# Patient Record
Sex: Female | Born: 1945 | Race: White | Hispanic: No
Health system: Southern US, Community
[De-identification: ages and names within clinical notes are randomized; demographics above are authoritative.]

## PROBLEM LIST (undated history)

## (undated) DIAGNOSIS — E039 Hypothyroidism, unspecified: Secondary | ICD-10-CM

## (undated) DIAGNOSIS — R112 Nausea with vomiting, unspecified: Secondary | ICD-10-CM

## (undated) DIAGNOSIS — Z9889 Other specified postprocedural states: Secondary | ICD-10-CM

## (undated) DIAGNOSIS — R011 Cardiac murmur, unspecified: Secondary | ICD-10-CM

## (undated) DIAGNOSIS — Q201 Double outlet right ventricle: Secondary | ICD-10-CM

## (undated) DIAGNOSIS — Z8719 Personal history of other diseases of the digestive system: Secondary | ICD-10-CM

## (undated) DIAGNOSIS — S8290XA Unspecified fracture of unspecified lower leg, initial encounter for closed fracture: Secondary | ICD-10-CM

## (undated) HISTORY — PX: NASAL SEPTUM SURGERY: SHX37

## (undated) HISTORY — PX: CHOLECYSTECTOMY: SHX55

## (undated) HISTORY — PX: CARDIAC CATHETERIZATION: SHX172

## (undated) HISTORY — PX: APPENDECTOMY: SHX54

## (undated) HISTORY — PX: TONSILLECTOMY: SUR1361

## (undated) HISTORY — PX: ABDOMINAL HYSTERECTOMY: SHX81

## (undated) HISTORY — PX: TOOTH EXTRACTION: SHX859

---

## 2011-05-12 DIAGNOSIS — J069 Acute upper respiratory infection, unspecified: Secondary | ICD-10-CM | POA: Diagnosis not present

## 2012-01-02 DIAGNOSIS — E7800 Pure hypercholesterolemia, unspecified: Secondary | ICD-10-CM | POA: Diagnosis not present

## 2012-01-02 DIAGNOSIS — R5383 Other fatigue: Secondary | ICD-10-CM | POA: Diagnosis not present

## 2012-01-02 DIAGNOSIS — E559 Vitamin D deficiency, unspecified: Secondary | ICD-10-CM | POA: Diagnosis not present

## 2012-04-13 DIAGNOSIS — E2839 Other primary ovarian failure: Secondary | ICD-10-CM | POA: Diagnosis not present

## 2012-04-13 DIAGNOSIS — E039 Hypothyroidism, unspecified: Secondary | ICD-10-CM | POA: Diagnosis not present

## 2012-04-13 DIAGNOSIS — E7800 Pure hypercholesterolemia, unspecified: Secondary | ICD-10-CM | POA: Diagnosis not present

## 2012-12-09 DIAGNOSIS — R300 Dysuria: Secondary | ICD-10-CM | POA: Diagnosis not present

## 2013-05-03 DIAGNOSIS — E039 Hypothyroidism, unspecified: Secondary | ICD-10-CM | POA: Diagnosis not present

## 2013-05-17 DIAGNOSIS — E039 Hypothyroidism, unspecified: Secondary | ICD-10-CM | POA: Diagnosis not present

## 2013-05-17 DIAGNOSIS — R5383 Other fatigue: Secondary | ICD-10-CM | POA: Diagnosis not present

## 2013-05-17 DIAGNOSIS — E559 Vitamin D deficiency, unspecified: Secondary | ICD-10-CM | POA: Diagnosis not present

## 2013-05-17 DIAGNOSIS — R5381 Other malaise: Secondary | ICD-10-CM | POA: Diagnosis not present

## 2013-08-27 ENCOUNTER — Emergency Department (HOSPITAL_COMMUNITY)
Admission: EM | Admit: 2013-08-27 | Discharge: 2013-08-27 | Disposition: A | Discharge status: Home / Self Care | Payer: Medicare Other | Attending: Emergency Medicine | Admitting: Emergency Medicine

## 2013-08-27 ENCOUNTER — Encounter (HOSPITAL_COMMUNITY): Payer: Self-pay | Admitting: Emergency Medicine

## 2013-08-27 ENCOUNTER — Emergency Department (HOSPITAL_COMMUNITY): Payer: Medicare Other

## 2013-08-27 DIAGNOSIS — S8990XA Unspecified injury of unspecified lower leg, initial encounter: Secondary | ICD-10-CM | POA: Diagnosis not present

## 2013-08-27 DIAGNOSIS — S99919A Unspecified injury of unspecified ankle, initial encounter: Secondary | ICD-10-CM | POA: Diagnosis not present

## 2013-08-27 DIAGNOSIS — Y929 Unspecified place or not applicable: Secondary | ICD-10-CM | POA: Insufficient documentation

## 2013-08-27 DIAGNOSIS — Z91040 Latex allergy status: Secondary | ICD-10-CM | POA: Insufficient documentation

## 2013-08-27 DIAGNOSIS — S4980XA Other specified injuries of shoulder and upper arm, unspecified arm, initial encounter: Secondary | ICD-10-CM | POA: Diagnosis not present

## 2013-08-27 DIAGNOSIS — M2569 Stiffness of other specified joint, not elsewhere classified: Secondary | ICD-10-CM | POA: Insufficient documentation

## 2013-08-27 DIAGNOSIS — IMO0002 Reserved for concepts with insufficient information to code with codable children: Secondary | ICD-10-CM | POA: Diagnosis not present

## 2013-08-27 DIAGNOSIS — M2560 Stiffness of unspecified joint, not elsewhere classified: Secondary | ICD-10-CM

## 2013-08-27 DIAGNOSIS — M25519 Pain in unspecified shoulder: Secondary | ICD-10-CM | POA: Diagnosis not present

## 2013-08-27 DIAGNOSIS — Y939 Activity, unspecified: Secondary | ICD-10-CM | POA: Insufficient documentation

## 2013-08-27 DIAGNOSIS — S46909A Unspecified injury of unspecified muscle, fascia and tendon at shoulder and upper arm level, unspecified arm, initial encounter: Secondary | ICD-10-CM | POA: Diagnosis not present

## 2013-08-27 DIAGNOSIS — S99929A Unspecified injury of unspecified foot, initial encounter: Secondary | ICD-10-CM

## 2013-08-27 DIAGNOSIS — W19XXXA Unspecified fall, initial encounter: Secondary | ICD-10-CM

## 2013-08-27 DIAGNOSIS — R296 Repeated falls: Secondary | ICD-10-CM | POA: Insufficient documentation

## 2013-08-27 DIAGNOSIS — S46911A Strain of unspecified muscle, fascia and tendon at shoulder and upper arm level, right arm, initial encounter: Secondary | ICD-10-CM

## 2013-08-27 MED ORDER — TRAMADOL HCL 50 MG PO TABS: 50.0000 mg | ORAL_TABLET | Freq: Four times a day (QID) | ORAL | Status: DC | PRN

## 2013-08-27 MED ORDER — METHOCARBAMOL 500 MG PO TABS: 500.0000 mg | ORAL_TABLET | Freq: Two times a day (BID) | ORAL | Status: DC

## 2013-08-27 MED ORDER — IBUPROFEN 400 MG PO TABS
400.0000 mg | ORAL_TABLET | Freq: Once | ORAL | Status: AC
  Administered 2013-08-27: 400 mg via ORAL
  Filled 2013-08-27: qty 1

## 2013-08-27 NOTE — ED Notes (Signed)
The pt fell upstairs on the wet floor 1330.  The pt  Is c/o of rt shoulder and rt upper chest

## 2013-08-27 NOTE — ED Notes (Signed)
ED PA at bedside

## 2013-08-27 NOTE — Discharge Instructions (Signed)
RICE: Routine Care for Injuries The routine care of many injuries includes Rest, Ice, Compression, and Elevation (RICE). HOME CARE INSTRUCTIONS  Rest is needed to allow your body to heal. Routine activities can usually be resumed when comfortable. Injured tendons and bones can take up to 6 weeks to heal. Tendons are the cord-like structures that attach muscle to bone.  Ice following an injury helps keep the swelling down and reduces pain.  Put ice in a plastic bag.  Place a towel between your skin and the bag.  Leave the ice on for 15-20 minutes, 03-04 times a day. Do this while awake, for the first 24 to 48 hours. After that, continue as directed by your caregiver.  Compression helps keep swelling down. It also gives support and helps with discomfort. If an elastic bandage has been applied, it should be removed and reapplied every 3 to 4 hours. It should not be applied tightly, but firmly enough to keep swelling down. Watch fingers or toes for swelling, bluish discoloration, coldness, numbness, or excessive pain. If any of these problems occur, remove the bandage and reapply loosely. Contact your caregiver if these problems continue.  Elevation helps reduce swelling and decreases pain. With extremities, such as the arms, hands, legs, and feet, the injured area should be placed near or above the level of the heart, if possible. SEEK IMMEDIATE MEDICAL CARE IF:  You have persistent pain and swelling.  You develop redness, numbness, or unexpected weakness.  Your symptoms are getting worse rather than improving after several days. These symptoms may indicate that further evaluation or further X-rays are needed. Sometimes, X-rays may not show a small broken bone (fracture) until 1 week or 10 days later. Make a follow-up appointment with your caregiver. Ask when your X-ray results will be ready. Make sure you get your X-ray results. Document Released: 08/03/2000 Document Revised: 07/14/2011  Document Reviewed: 09/20/2010 [employer] Patient Information 2014 [employer].  

## 2013-08-27 NOTE — ED Provider Notes (Signed)
CSN: [id number]     Arrival date & time 08/27/13  1529 History   First MD Initiated Contact with Patient 08/27/13 1605 This chart was scribed for non-physician practitioner Fayrene HelperBowie Lundon Rosier, PA-C working with Gavin PoundMichael Y. Oletta LamasGhim, MD by Valera CastleSteven Perry, ED scribe. This patient was seen in room TR11C/TR11C and the patient's care was started at 4:19 PM.   Chief Complaint  Patient presents with  . Fall   (Consider location/radiation/quality/duration/timing/severity/associated sxs/prior Treatment) The history is provided by the patient. No language interpreter was used.   HPI Comments: Emily Berger is a 68 y.o. female who presents to the Emergency Department complaining of constant, right shoulder pain around her clavicle, onset around 1:30 pm today when she slipped on a wet floor, walked a few feet, then fell backwards landing awkwardly on her right arm when she tried to catch herself. She reports her pain radiates to her right shoulder blade and also to her right upper chest. She reports associated right shoulder and right neck stiffness. She reports her right shoulder pain is exacerbated with deep breathing and with right arm movement. She also reports mild, constant left knee pain, with mild swelling, onset since the fall. She denies taking any pain medication PTA. She denies h/o injury to her right shoulder, but reports h/o right elbow fx. She denies any broken bones with her fall. She denies hitting her head, LOC, wounds, and any other associated symptoms. She denies any medical history. No prior sxs prior to fall.   PCP - No primary provider on file.  History reviewed. No pertinent past medical history. History reviewed. No pertinent past surgical history. No family history on file. History  Substance Use Topics  . Smoking status: Never Smoker   . Smokeless tobacco: Not on file  . Alcohol Use: No   OB History   Grav Para Term Preterm Abortions TAB SAB Ect Mult Living                 Review of  Systems  Musculoskeletal: Positive for arthralgias (right shoulder, left knee), joint swelling (left knee), myalgias and neck stiffness. Negative for back pain and neck pain.  Skin: Negative for wound.  Neurological: Negative for dizziness, syncope, weakness and headaches.   Allergies  Latex and Percocet  Home Medications   Prior to Admission medications   Not on File   BP 115/63  Pulse 82  Temp(Src) 97.9 F (36.6 C) (Oral)  Resp 16  SpO2 98%  Physical Exam  Nursing note and vitals reviewed. Constitutional: She is oriented to person, place, and time. She appears well-developed and well-nourished. No distress.  HENT:  Head: Normocephalic and atraumatic.  Eyes: EOM are normal.  Neck: Neck supple. No tracheal deviation present.  Cardiovascular: Normal rate.   Pulmonary/Chest: Effort normal. No respiratory distress.  Musculoskeletal: She exhibits tenderness.  Mild tenderness noted to right anterior shoulder with palpation. Pt has decreased ROM especially when lifting arm above shoulder. No crepitus or deformity noted. Left knee: Tenderness noted to anterior aspect of left knee superior to patella without obvious deformity. Negative anterior and posterior drawer test. Normal varus and valgus movement.   Neurological: She is alert and oriented to person, place, and time.  Skin: Skin is warm and dry.  Psychiatric: She has a normal mood and affect. Her behavior is normal.   ED Course  Procedures (including critical care time)  DIAGNOSTIC STUDIES: Oxygen Saturation is 98% on room air, normal by my interpretation.    COORDINATION  OF CARE: 4:31 PM - Do not suspect fx, but when xray was offered pt complied.   5:21 PM Xray neg for acute fx.  Reassurance given.  Ibuprofen for pain.  RICE therapy discussed.  Return precaution given. Ortho referral as needed.  Pt otherwise without red flags, and NVI.    No results found for this or any previous visit. Dg Shoulder Right  08/27/2013    CLINICAL DATA:  Pt states that she slipped on a freshly mopped floor, and hurt her shoulder as she was trying to catch herself. Pt states that pain radiates from the the sternum to the inferior portion of the scapula  EXAM: RIGHT SHOULDER - 2+ VIEW  COMPARISON:  None.  FINDINGS: Glenohumeral joint is intact. No evidence of scapular fracture or humeral fracture. The acromioclavicular joint is intact.  IMPRESSION: No fracture dislocation.   Electronically Signed   By: Genevive BiStewart  Edmunds M.D.   On: 08/27/2013 17:09    EKG Interpretation None     Medications  ibuprofen (ADVIL,MOTRIN) tablet 400 mg (400 mg Oral Given 08/27/13 1638)   MDM   Final diagnoses:  Fall from standing  Right shoulder strain    BP 115/63  Pulse 82  Temp(Src) 97.9 F (36.6 C) (Oral)  Resp 16  SpO2 98%  I have reviewed nursing notes and vital signs. I personally reviewed the imaging tests through PACS system  I reviewed available ER/hospitalization records thought the EMR   I personally performed the services described in this documentation, which was scribed in my presence. The recorded information has been reviewed and is accurate.     Fayrene HelperBowie Olanna Percifield, PA-C 08/27/13 1722

## 2013-08-27 NOTE — ED Notes (Signed)
Patient given juice per request

## 2013-08-28 NOTE — ED Provider Notes (Signed)
Medical screening examination/treatment/procedure(s) were performed by non-physician practitioner and as supervising physician I was immediately available for consultation/collaboration.  Jeffre Enriques Y. Benedicta Sultan, MD 08/28/13 0038 

## 2013-10-25 DIAGNOSIS — H60399 Other infective otitis externa, unspecified ear: Secondary | ICD-10-CM | POA: Diagnosis not present

## 2014-08-08 DIAGNOSIS — E039 Hypothyroidism, unspecified: Secondary | ICD-10-CM | POA: Diagnosis not present

## 2014-08-08 DIAGNOSIS — Z9119 Patient's noncompliance with other medical treatment and regimen: Secondary | ICD-10-CM | POA: Diagnosis not present

## 2014-09-25 DIAGNOSIS — E039 Hypothyroidism, unspecified: Secondary | ICD-10-CM | POA: Diagnosis not present

## 2015-01-23 DIAGNOSIS — R0789 Other chest pain: Secondary | ICD-10-CM | POA: Diagnosis not present

## 2015-01-23 DIAGNOSIS — K449 Diaphragmatic hernia without obstruction or gangrene: Secondary | ICD-10-CM | POA: Diagnosis not present

## 2015-01-23 DIAGNOSIS — R05 Cough: Secondary | ICD-10-CM | POA: Diagnosis not present

## 2016-08-29 DIAGNOSIS — E785 Hyperlipidemia, unspecified: Secondary | ICD-10-CM | POA: Diagnosis not present

## 2016-08-29 DIAGNOSIS — E559 Vitamin D deficiency, unspecified: Secondary | ICD-10-CM | POA: Diagnosis not present

## 2016-08-29 DIAGNOSIS — E039 Hypothyroidism, unspecified: Secondary | ICD-10-CM | POA: Diagnosis not present

## 2016-09-10 DIAGNOSIS — Z0000 Encounter for general adult medical examination without abnormal findings: Secondary | ICD-10-CM | POA: Diagnosis not present

## 2016-09-10 DIAGNOSIS — E559 Vitamin D deficiency, unspecified: Secondary | ICD-10-CM | POA: Diagnosis not present

## 2016-09-10 DIAGNOSIS — E039 Hypothyroidism, unspecified: Secondary | ICD-10-CM | POA: Diagnosis not present

## 2017-03-03 DIAGNOSIS — H25812 Combined forms of age-related cataract, left eye: Secondary | ICD-10-CM | POA: Diagnosis not present

## 2017-06-25 DIAGNOSIS — E039 Hypothyroidism, unspecified: Secondary | ICD-10-CM | POA: Diagnosis not present

## 2017-06-25 DIAGNOSIS — F29 Unspecified psychosis not due to a substance or known physiological condition: Secondary | ICD-10-CM | POA: Diagnosis not present

## 2017-06-26 DIAGNOSIS — Z0001 Encounter for general adult medical examination with abnormal findings: Secondary | ICD-10-CM | POA: Diagnosis not present

## 2017-06-26 DIAGNOSIS — E039 Hypothyroidism, unspecified: Secondary | ICD-10-CM | POA: Diagnosis not present

## 2017-07-02 DIAGNOSIS — F29 Unspecified psychosis not due to a substance or known physiological condition: Secondary | ICD-10-CM | POA: Diagnosis not present

## 2017-07-02 DIAGNOSIS — E039 Hypothyroidism, unspecified: Secondary | ICD-10-CM | POA: Diagnosis not present

## 2017-07-21 DIAGNOSIS — F29 Unspecified psychosis not due to a substance or known physiological condition: Secondary | ICD-10-CM | POA: Diagnosis not present

## 2017-07-21 DIAGNOSIS — F22 Delusional disorders: Secondary | ICD-10-CM | POA: Diagnosis not present

## 2017-07-21 DIAGNOSIS — E039 Hypothyroidism, unspecified: Secondary | ICD-10-CM | POA: Diagnosis not present

## 2018-01-12 DIAGNOSIS — E039 Hypothyroidism, unspecified: Secondary | ICD-10-CM | POA: Diagnosis not present

## 2018-01-12 DIAGNOSIS — F29 Unspecified psychosis not due to a substance or known physiological condition: Secondary | ICD-10-CM | POA: Diagnosis not present

## 2018-01-12 DIAGNOSIS — F22 Delusional disorders: Secondary | ICD-10-CM | POA: Diagnosis not present

## 2018-01-12 DIAGNOSIS — F064 Anxiety disorder due to known physiological condition: Secondary | ICD-10-CM | POA: Diagnosis not present

## 2018-07-10 ENCOUNTER — Encounter (HOSPITAL_COMMUNITY): Payer: Self-pay | Admitting: Emergency Medicine

## 2018-07-10 ENCOUNTER — Emergency Department (HOSPITAL_COMMUNITY): Payer: Medicare Other

## 2018-07-10 ENCOUNTER — Emergency Department (HOSPITAL_COMMUNITY)
Admission: EM | Admit: 2018-07-10 | Discharge: 2018-07-11 | Disposition: A | Discharge status: Home / Self Care | Payer: Medicare Other | Attending: Emergency Medicine | Admitting: Emergency Medicine

## 2018-07-10 ENCOUNTER — Other Ambulatory Visit: Payer: Self-pay

## 2018-07-10 DIAGNOSIS — W108XXA Fall (on) (from) other stairs and steps, initial encounter: Secondary | ICD-10-CM | POA: Insufficient documentation

## 2018-07-10 DIAGNOSIS — Y92009 Unspecified place in unspecified non-institutional (private) residence as the place of occurrence of the external cause: Secondary | ICD-10-CM | POA: Diagnosis not present

## 2018-07-10 DIAGNOSIS — Z91040 Latex allergy status: Secondary | ICD-10-CM | POA: Insufficient documentation

## 2018-07-10 DIAGNOSIS — Z79899 Other long term (current) drug therapy: Secondary | ICD-10-CM | POA: Diagnosis not present

## 2018-07-10 DIAGNOSIS — Y999 Unspecified external cause status: Secondary | ICD-10-CM | POA: Diagnosis not present

## 2018-07-10 DIAGNOSIS — S82852A Displaced trimalleolar fracture of left lower leg, initial encounter for closed fracture: Secondary | ICD-10-CM | POA: Insufficient documentation

## 2018-07-10 DIAGNOSIS — M25579 Pain in unspecified ankle and joints of unspecified foot: Secondary | ICD-10-CM

## 2018-07-10 DIAGNOSIS — Y9301 Activity, walking, marching and hiking: Secondary | ICD-10-CM | POA: Insufficient documentation

## 2018-07-10 DIAGNOSIS — M25572 Pain in left ankle and joints of left foot: Secondary | ICD-10-CM

## 2018-07-10 DIAGNOSIS — S82821A Torus fracture of lower end of right fibula, initial encounter for closed fracture: Secondary | ICD-10-CM | POA: Diagnosis not present

## 2018-07-10 DIAGNOSIS — S9305XA Dislocation of left ankle joint, initial encounter: Secondary | ICD-10-CM

## 2018-07-10 DIAGNOSIS — S82899A Other fracture of unspecified lower leg, initial encounter for closed fracture: Secondary | ICD-10-CM

## 2018-07-10 DIAGNOSIS — S99912A Unspecified injury of left ankle, initial encounter: Secondary | ICD-10-CM | POA: Diagnosis present

## 2018-07-10 MED ORDER — FENTANYL CITRATE (PF) 100 MCG/2ML IJ SOLN
100.0000 ug | Freq: Once | INTRAMUSCULAR | Status: AC
  Administered 2018-07-10: 100 ug via INTRAVENOUS
  Filled 2018-07-10: qty 2

## 2018-07-10 MED ORDER — PROPOFOL 10 MG/ML IV BOLUS
INTRAVENOUS | Status: AC
  Administered 2018-07-10: 50 mg via INTRAVENOUS
  Filled 2018-07-10: qty 20

## 2018-07-10 MED ORDER — PROPOFOL 10 MG/ML IV BOLUS
0.5000 mg/kg | Freq: Once | INTRAVENOUS | Status: AC
Start: 2018-07-10 — End: 2018-07-10
  Administered 2018-07-10: 50 mg via INTRAVENOUS
  Filled 2018-07-10: qty 20

## 2018-07-10 MED ORDER — PROPOFOL 10 MG/ML IV BOLUS
0.5000 mg/kg | Freq: Once | INTRAVENOUS | Status: AC
  Administered 2018-07-10: 50 mg via INTRAVENOUS

## 2018-07-10 MED ORDER — LIDOCAINE-EPINEPHRINE (PF) 2 %-1:200000 IJ SOLN
20.0000 mL | Freq: Once | INTRAMUSCULAR | Status: AC
  Administered 2018-07-10: 10 mL
  Filled 2018-07-10: qty 20

## 2018-07-10 MED ORDER — LIDOCAINE-EPINEPHRINE 2 %-1:100000 IJ SOLN: 20.0000 mL | Freq: Once | INTRAMUSCULAR | Status: DC

## 2018-07-10 MED ORDER — MORPHINE SULFATE 15 MG PO TABS: 15.0000 mg | ORAL_TABLET | ORAL | 0 refills | Status: DC | PRN

## 2018-07-10 NOTE — ED Notes (Signed)
Bed: WA21 Expected date:  Expected time:  Means of arrival:  Comments: EMS 73 yo female from home-fall/deformity left ankle-outward rotation-good PMS/Fentanyl 150 mcg

## 2018-07-10 NOTE — ED Triage Notes (Signed)
Patient arrives by [facility] after a fall-missed the last step on the stairs and fell injuring left ankle-positive deformity with outward rotation per EMS-PMS intact. Fentanyl 150 mcg administered for pain per EMS.

## 2018-07-10 NOTE — ED Provider Notes (Signed)
[facility]-EMERGENCY DEPT Provider Note   CSN: [id number] Arrival date & time: 07/10/18  2004    History   Chief Complaint Chief Complaint  Patient presents with  . Fall  . Left ankle injury    HPI Emily Berger is a 73 y.o. female.     73 yo F with a chief complaint of fall.  Patient fell down 1 step.  States that she has been staying at a place where the stairs in the ground look very similar and so she has been counting steps but she missed counted them while she was at the bottom but had another stepped ago and her heel got caught on the bottom step and she inverted her ankle.  Pain and deformity was noted and she was unable to ambulate afterwards.  She denies pain to the knee denies other injury denies head injury chest pain shortness of breath neck pain back pain.  The history is provided by the patient.  Fall  Pertinent negatives include no chest pain, no abdominal pain, no headaches and no shortness of breath.  Injury  This is a new problem. The current episode started less than 1 hour ago. The problem occurs constantly. The problem has not changed since onset.Pertinent negatives include no chest pain, no abdominal pain, no headaches and no shortness of breath. The symptoms are aggravated by bending and twisting. Nothing relieves the symptoms. She has tried nothing for the symptoms. The treatment provided no relief.    History reviewed. No pertinent past medical history.  There are no active problems to display for this patient.   History reviewed. No pertinent surgical history.   OB History   No obstetric history on file.      Home Medications    Prior to Admission medications   Medication Sig Start Date End Date Taking? Authorizing Provider  methocarbamol (ROBAXIN) 500 MG tablet Take 1 tablet (500 mg total) by mouth 2 (two) times daily. 08/27/13   Fayrene Helper, PA-C  thyroid (ARMOUR) 30 MG tablet Take 30 mg by mouth daily before breakfast.     [provider]  traMADol (ULTRAM) 50 MG tablet Take 1 tablet (50 mg total) by mouth every 6 (six) hours as needed for severe pain. 08/27/13   Fayrene Helper, PA-C    Family History No family history on file.  Social History Social History   Tobacco Use  . Smoking status: Never Smoker  . Smokeless tobacco: Never Used  Substance Use Topics  . Alcohol use: No  . Drug use: Not on file     Allergies   Latex; Ambien [zolpidem tartrate]; and Percocet [oxycodone-acetaminophen]   Review of Systems Review of Systems  Constitutional: Negative for chills and fever.  HENT: Negative for congestion and rhinorrhea.   Eyes: Negative for redness and visual disturbance.  Respiratory: Negative for shortness of breath and wheezing.   Cardiovascular: Negative for chest pain and palpitations.  Gastrointestinal: Negative for abdominal pain, nausea and vomiting.  Genitourinary: Negative for dysuria and urgency.  Musculoskeletal: Positive for arthralgias and myalgias.  Skin: Negative for pallor and wound.  Neurological: Negative for dizziness and headaches.     Physical Exam Updated Vital Signs There were no vitals taken for this visit.  Physical Exam Vitals signs and nursing note reviewed.  Constitutional:      General: She is not in acute distress.    Appearance: She is well-developed. She is not diaphoretic.  HENT:     Head: Normocephalic  and atraumatic.  Eyes:     Pupils: Pupils are equal, round, and reactive to light.  Neck:     Musculoskeletal: Normal range of motion and neck supple.  Cardiovascular:     Rate and Rhythm: Normal rate and regular rhythm.     Heart sounds: No murmur. No friction rub. No gallop.   Pulmonary:     Effort: Pulmonary effort is normal.     Breath sounds: No wheezing or rales.  Abdominal:     General: There is no distension.     Palpations: Abdomen is soft.     Tenderness: There is no abdominal tenderness.  Musculoskeletal:        General:  Tenderness and deformity present.     Comments: PMS intact to the L foot.  Ankle is obviously dislocated.  Laterally displaced.  Skin:    General: Skin is warm and dry.  Neurological:     Mental Status: She is alert and oriented to person, place, and time.  Psychiatric:        Behavior: Behavior normal.      ED Treatments / Results  Labs (all labs ordered are listed, but only abnormal results are displayed) Labs Reviewed - No data to display  EKG None  Radiology No results found.  Procedures Reduction of dislocation Date/Time: 07/10/2018 11:24 PM Performed by: Melene Plan, DO Authorized by: Melene Plan, DO  Consent: Verbal consent obtained. Risks and benefits: risks, benefits and alternatives were discussed Consent given by: patient Imaging studies: imaging studies available Patient identity confirmed: verbally with patient Time out: Immediately prior to procedure a "time out" was called to verify the correct patient, procedure, equipment, support staff and site/side marked as required. Local anesthesia used: yes Anesthesia: local infiltration  Anesthesia: Local anesthesia used: yes Local Anesthetic: lidocaine 1% with epinephrine Anesthetic total: 10 mL  Sedation: Patient sedated: yes Sedation type: moderate (conscious) sedation Sedatives: fentanyl and propofol Analgesia: fentanyl Vitals: Vital signs were monitored during sedation.  Patient tolerance: Patient tolerated the procedure well with no immediate complications  .Joint Aspiration/Arthrocentesis Date/Time: 07/10/2018 11:24 PM Performed by: Melene Plan, DO Authorized by: Melene Plan, DO   Consent:    Consent obtained:  Verbal   Consent given by:  Patient   Risks discussed:  Bleeding, infection, incomplete drainage and nerve damage   Alternatives discussed:  No treatment and delayed treatment Location:    Location:  Ankle   Ankle:  R ankle Anesthesia (see MAR for exact dosages):    Anesthesia method:   None Procedure details:    Needle gauge: 21.   Ultrasound guidance: no     Approach:  Anterior   Aspirate amount:  0   Aspirate characteristics:  Bloody   Steroid injected: no     Specimen collected: no   Post-procedure details:    Dressing:  Adhesive bandage   Patient tolerance of procedure:  Tolerated well, no immediate complications Comments:     Lidocaine for reduction .Sedation Date/Time: 07/10/2018 11:25 PM Performed by: Melene Plan, DO Authorized by: Melene Plan, DO   Consent:    Consent obtained:  Verbal   Consent given by:  Patient   Risks discussed:  Allergic reaction, dysrhythmia, inadequate sedation, nausea, vomiting and respiratory compromise necessitating ventilatory assistance and intubation   Alternatives discussed:  Analgesia without sedation, anxiolysis and regional anesthesia Universal protocol:    Imaging studies available: yes     Immediately prior to procedure a time out was called: yes  Patient identity confirmation method:  Verbally with patient and arm band Indications:    Procedure performed:  Dislocation reduction   Procedure necessitating sedation performed by:  Physician performing sedation Pre-sedation assessment:    Time since last food or drink:  4   ASA classification: class 2 - patient with mild systemic disease     Neck mobility: normal     Mouth opening:  3 or more finger widths   Thyromental distance:  3 finger widths   Mallampati score:  II - soft palate, uvula, fauces visible   Pre-sedation assessments completed and reviewed: airway patency, cardiovascular function, hydration status, mental status, nausea/vomiting, pain level, respiratory function and temperature   Immediate pre-procedure details:    Reassessment: Patient reassessed immediately prior to procedure     Reviewed: vital signs, relevant labs/tests and NPO status     Verified: bag valve mask available, emergency equipment available, intubation equipment available, IV patency  confirmed, oxygen available and suction available   Procedure details (see MAR for exact dosages):    Preoxygenation:  Nasal cannula   Sedation:  Propofol   Analgesia:  Fentanyl   Intra-procedure monitoring:  Blood pressure monitoring, cardiac monitor, continuous capnometry, continuous pulse oximetry, frequent LOC assessments and frequent vital sign checks   Intra-procedure events: hypoxia and respiratory depression     Intra-procedure events comment:  Period of apnea with O2 into the 70's, improved with stimulation and deep breathing   Intra-procedure management:  Supplemental oxygen   Total Provider sedation time (minutes):  60 Post-procedure details:    Attendance: Constant attendance by certified staff until patient recovered     Recovery: Patient returned to pre-procedure baseline     Post-sedation assessments completed and reviewed: airway patency, cardiovascular function, hydration status, mental status, nausea/vomiting, pain level, respiratory function and temperature     Patient is stable for discharge or admission: yes     Patient tolerance:  Tolerated well, no immediate complications   (including critical care time)  Medications Ordered in ED Medications  lidocaine-EPINEPHrine (XYLOCAINE W/EPI) 2 %-1:200000 (PF) injection 20 mL (has no administration in time range)     Initial Impression / Assessment and Plan / ED Course  I have reviewed the triage vital signs and the nursing notes.  Pertinent labs & imaging results that were available during my care of the patient were reviewed by me and considered in my medical decision making (see chart for details).        73 yo F with a chief complaint of left ankle pain.  This is after she fell down a step.  Inversion injury.  Obviously dislocated, will obtain a plain film.  Plain film with fracture dislocation is viewed by me.  Initial attempts to reduce was successful but during the splinting process the patient's ankle had  redislocated.  Discussed with orthopedics he recommended repeat attempt.  Repeated times with good reduction.  Patient was complaining of right ankle pain on my repeat evaluation, plain film viewed by me with a fibular fracture.  Will place in a cam walker as she is nonweightbearing in the left lower extremity.  Ortho follow-up.  11:33 PM:  I have discussed the diagnosis/risks/treatment options with the patient and family and believe the pt to be eligible for discharge home to follow-up with Ortho. We also discussed returning to the ED immediately if new or worsening sx occur. We discussed the sx which are most concerning (e.g., sudden worsening pain, fever, inability to tolerate by mouth) that  necessitate immediate return. Medications administered to the patient during their visit and any new prescriptions provided to the patient are listed below.  Medications given during this visit Medications  lidocaine-EPINEPHrine (XYLOCAINE W/EPI) 2 %-1:200000 (PF) injection 20 mL (10 mLs Infiltration Given by Other 07/10/18 2142)  fentaNYL (SUBLIMAZE) injection 100 mcg (100 mcg Intravenous Given 07/10/18 2135)  propofol (DIPRIVAN) 10 mg/mL bolus/IV push 31.1 mg (50 mg Intravenous Given 07/10/18 2138)  propofol (DIPRIVAN) 10 mg/mL bolus/IV push 31.1 mg (50 mg Intravenous Given 07/10/18 2245)     The patient appears reasonably screen and/or stabilized for discharge and I doubt any other medical condition or other [facility] requiring further screening, evaluation, or treatment in the ED at this time prior to discharge.    Final Clinical Impressions(s) / ED Diagnoses   Final diagnoses:  Left ankle pain    ED Discharge Orders    None       Melene Plan, DO 07/10/18 2334

## 2018-07-10 NOTE — Discharge Instructions (Signed)

## 2018-07-19 NOTE — Pre-Procedure Instructions (Signed)
Karalena Eddings [facility]  07/19/2018      [id number] [place], [place] - [address] AT [facility] [address] [facility] [address] [facility] [address] [place] [address] Phone: [phone] Fax: [phone]    Your procedure is scheduled on March 19th, 2020.  Report to [facility] Admitting at 06:30 A.M.  Call this number if you have problems the morning of surgery:  [phone]   Remember:  Do not eat or drink after midnight.    Take these medicines the morning of surgery with A SIP OF WATER: armour thyroid, and  Morphine if needed.    Do not wear jewelry, make-up or nail polish.  Do not wear lotions, powders, or perfumes, or deodorant.  Do not shave 48 hours prior to surgery.  Men may shave face and neck.  Do not bring valuables to the hospital.  [facility] is not responsible for any belongings or valuables.  Contacts, dentures or bridgework may not be worn into surgery.  Leave your suitcase in the car.  After surgery it may be brought to your room.  For patients admitted to the hospital, discharge time will be determined by your treatment team.  Patients discharged the day of surgery will not be allowed to drive home.    [facility]- Preparing For Surgery  Before surgery, you can play an important role. Because skin is not sterile, your skin needs to be as free of germs as possible. You can reduce the number of germs on your skin by washing with CHG (chlorahexidine gluconate) Soap before surgery.  CHG is an antiseptic cleaner which kills germs and bonds with the skin to continue killing germs even after washing.    Oral Hygiene is also important to reduce your risk of infection.  Remember - BRUSH YOUR TEETH THE MORNING OF SURGERY WITH YOUR REGULAR TOOTHPASTE  Please do not use if you have an allergy to CHG or antibacterial soaps. If your skin becomes reddened/irritated stop using the CHG.  Do not shave (including legs and underarms) for at least 48  hours prior to first CHG shower. It is OK to shave your face.  Please follow these instructions carefully.   1. Shower the NIGHT BEFORE SURGERY and the MORNING OF SURGERY with CHG.   2. If you chose to wash your hair, wash your hair first as usual with your normal shampoo.  3. After you shampoo, rinse your hair and body thoroughly to remove the shampoo.  4. Use CHG as you would any other liquid soap. You can apply CHG directly to the skin and wash gently with a scrungie or a clean washcloth.   5. Apply the CHG Soap to your body ONLY FROM THE NECK DOWN.  Do not use on open wounds or open sores. Avoid contact with your eyes, ears, mouth and genitals (private parts). Wash Face and genitals (private parts)  with your normal soap.  6. Wash thoroughly, paying special attention to the area where your surgery will be performed.  7. Thoroughly rinse your body with warm water from the neck down.  8. DO NOT shower/wash with your normal soap after using and rinsing off the CHG Soap.  9. Pat yourself dry with a CLEAN TOWEL.  10. Wear CLEAN PAJAMAS to bed the night before surgery, wear comfortable clothes the morning of surgery  11. Place CLEAN SHEETS on your bed the night of your first shower and DO NOT SLEEP WITH PETS.  Day of Surgery:  Do not apply any deodorants/lotions.  Please wear clean clothes to the hospital/surgery center.   Remember to brush your teeth WITH YOUR REGULAR TOOTHPASTE.

## 2018-07-20 ENCOUNTER — Other Ambulatory Visit (HOSPITAL_COMMUNITY): Payer: Self-pay | Admitting: Orthopedic Surgery

## 2018-07-20 ENCOUNTER — Encounter (HOSPITAL_COMMUNITY): Payer: Self-pay

## 2018-07-20 ENCOUNTER — Encounter (HOSPITAL_COMMUNITY)
Admission: RE | Admit: 2018-07-20 | Discharge: 2018-07-20 | Disposition: A | Discharge status: Home / Self Care | Payer: Medicare Other | Source: Ambulatory Visit | Attending: Orthopedic Surgery | Admitting: Orthopedic Surgery

## 2018-07-20 ENCOUNTER — Other Ambulatory Visit: Payer: Self-pay

## 2018-07-20 DIAGNOSIS — Z01812 Encounter for preprocedural laboratory examination: Secondary | ICD-10-CM

## 2018-07-20 HISTORY — DX: Personal history of other diseases of the digestive system: Z87.19

## 2018-07-20 HISTORY — DX: Cardiac murmur, unspecified: R01.1

## 2018-07-20 HISTORY — DX: Unspecified fracture of unspecified lower leg, initial encounter for closed fracture: S82.90XA

## 2018-07-20 HISTORY — DX: Other specified postprocedural states: R11.2

## 2018-07-20 HISTORY — DX: Hypothyroidism, unspecified: E03.9

## 2018-07-20 HISTORY — DX: Double outlet right ventricle: Q20.1

## 2018-07-20 HISTORY — DX: Other specified postprocedural states: Z98.890

## 2018-07-20 LAB — BASIC METABOLIC PANEL
Anion gap: 9 (ref 5–15)
BUN: 9 mg/dL (ref 8–23)
CO2: 26 mmol/L (ref 22–32)
CREATININE: 0.62 mg/dL (ref 0.44–1.00)
Calcium: 9.3 mg/dL (ref 8.9–10.3)
Chloride: 100 mmol/L (ref 98–111)
GFR calc Af Amer: >60 mL/min (ref >60)
GFR calc non Af Amer: >60 mL/min (ref >60)
Glucose, Bld: 95 mg/dL (ref 70–99)
Potassium: 3.5 mmol/L (ref 3.5–5.1)
Sodium: 135 mmol/L (ref 135–145)

## 2018-07-20 LAB — SURGICAL PCR SCREEN
MRSA, PCR: NEGATIVE
Staphylococcus aureus: NEGATIVE

## 2018-07-20 LAB — CBC
HCT: 38.4 % (ref 36.0–46.0)
Hemoglobin: 12.5 g/dL (ref 12.0–15.0)
MCH: 28.3 pg (ref 26.0–34.0)
MCHC: 32.6 g/dL (ref 30.0–36.0)
MCV: 86.9 fL (ref 80.0–100.0)
Platelets: 357 10*3/uL (ref 150–400)
RBC: 4.42 MIL/uL (ref 3.87–5.11)
RDW: 13.5 % (ref 11.5–15.5)
WBC: 9.3 10*3/uL (ref 4.0–10.5)
nRBC: 0 % (ref 0.0–0.2)

## 2018-07-20 NOTE — Progress Notes (Signed)
PCP - Nadyne Coombes Cardiologist - denies  Chest x-ray - not needed EKG - not needed Stress Test - denies ECHO - denies Cardiac Cath - denies  Anesthesia review: yes, Fayrene Fearing to review records requested  Patient denies shortness of breath, fever, cough and chest pain at PAT appointment   Patient verbalized understanding of instructions that were given to them at the PAT appointment. Patient was also instructed that they will need to review over the PAT instructions again at home before surgery.

## 2018-07-20 NOTE — Pre-Procedure Instructions (Signed)
Shawnika Riskin [facility]  07/20/2018      [id number] [place], [place] - [address] AT [facility] [address] [facility] [address] [facility] [address] [place] [address] Phone: [phone] Fax: [phone]    Your procedure is scheduled on March 19th, 2020.  Report to [facility] Admitting at 06:30 A.M.  Call this number if you have problems the morning of surgery:  [phone]   Remember:  Do not eat or drink after midnight.    Take these medicines the morning of surgery with A SIP OF WATER:  thyroid (ARMOUR THYROID) Morphine if needed  7 days prior to surgery STOP taking any Aspirin (unless otherwise instructed by your surgeon), Aleve, Naproxen, Ibuprofen, Motrin, Advil, Goody's, BC's, all herbal medications, fish oil, and all vitamins.     Do not wear jewelry, make-up or nail polish.  Do not wear lotions, powders, or perfumes, or deodorant.  Do not shave 48 hours prior to surgery.  Men may shave face and neck.  Do not bring valuables to the hospital.  [facility] is not responsible for any belongings or valuables.  Contacts, dentures or bridgework may not be worn into surgery.  Leave your suitcase in the car.  After surgery it may be brought to your room.  For patients admitted to the hospital, discharge time will be determined by your treatment team.  Patients discharged the day of surgery will not be allowed to drive home.    [place]- Preparing For Surgery  Before surgery, you can play an important role. Because skin is not sterile, your skin needs to be as free of germs as possible. You can reduce the number of germs on your skin by washing with CHG (chlorahexidine gluconate) Soap before surgery.  CHG is an antiseptic cleaner which kills germs and bonds with the skin to continue killing germs even after washing.    Oral Hygiene is also important to reduce your risk of infection.  Remember - BRUSH YOUR TEETH THE MORNING OF SURGERY WITH YOUR  REGULAR TOOTHPASTE  Please do not use if you have an allergy to CHG or antibacterial soaps. If your skin becomes reddened/irritated stop using the CHG.  Do not shave (including legs and underarms) for at least 48 hours prior to first CHG shower. It is OK to shave your face.  Please follow these instructions carefully.   1. Shower the NIGHT BEFORE SURGERY and the MORNING OF SURGERY with CHG.   2. If you chose to wash your hair, wash your hair first as usual with your normal shampoo.  3. After you shampoo, rinse your hair and body thoroughly to remove the shampoo.  4. Use CHG as you would any other liquid soap. You can apply CHG directly to the skin and wash gently with a scrungie or a clean washcloth.   5. Apply the CHG Soap to your body ONLY FROM THE NECK DOWN.  Do not use on open wounds or open sores. Avoid contact with your eyes, ears, mouth and genitals (private parts). Wash Face and genitals (private parts)  with your normal soap.  6. Wash thoroughly, paying special attention to the area where your surgery will be performed.  7. Thoroughly rinse your body with warm water from the neck down.  8. DO NOT shower/wash with your normal soap after using and rinsing off the CHG Soap.  9. Pat yourself dry with a CLEAN TOWEL.  10. Wear CLEAN PAJAMAS to  bed the night before surgery, wear comfortable clothes the morning of surgery  11. Place CLEAN SHEETS on your bed the night of your first shower and DO NOT SLEEP WITH PETS.    Day of Surgery:  Do not apply any deodorants/lotions.  Please wear clean clothes to the hospital/surgery center.   Remember to brush your teeth WITH YOUR REGULAR TOOTHPASTE.

## 2018-07-21 NOTE — Anesthesia Preprocedure Evaluation (Addendum)
Anesthesia Evaluation  Patient identified by MRN, date of birth, ID band Patient awake    Reviewed: Allergy & Precautions, NPO status , Patient's Chart, lab work & pertinent test results  History of Anesthesia Complications (+) PONV and history of anesthetic complications  Airway Mallampati: III  TM Distance: >3 FB Neck ROM: Full    Dental no notable dental hx. (+) Teeth Intact, Dental Advisory Given   Pulmonary neg pulmonary ROS,    Pulmonary exam normal breath sounds clear to auscultation       Cardiovascular Normal cardiovascular exam+ Valvular Problems/Murmurs (unrepaired VSD)  Rhythm:Regular Rate:Normal     Neuro/Psych negative neurological ROS  negative psych ROS   GI/Hepatic negative GI ROS, Neg liver ROS,   Endo/Other  Hypothyroidism   Renal/GU negative Renal ROS  negative genitourinary   Musculoskeletal negative musculoskeletal ROS (+)   Abdominal   Peds  Hematology negative hematology ROS (+)   Anesthesia Other Findings   Reproductive/Obstetrics negative OB ROS                            Anesthesia Physical Anesthesia Plan  ASA: II  Anesthesia Plan: Regional and MAC   Post-op Pain Management:    Induction: Intravenous  PONV Risk Score and Plan: 3 and Ondansetron, Dexamethasone, Treatment may vary due to age or medical condition, Midazolam and Propofol infusion  Airway Management Planned: Natural Airway and Simple Face Mask  Additional Equipment:   Intra-op Plan:   Post-operative Plan:   Informed Consent: I have reviewed the patients History and Physical, chart, labs and discussed the procedure including the risks, benefits and alternatives for the proposed anesthesia with the patient or authorized representative who has indicated his/her understanding and acceptance.     Dental advisory given  Plan Discussed with: CRNA  Anesthesia Plan Comments: (Pt reports  small VSD as child, says she was evaluated by multiple cardiologists and decision was made not to repair. Says she has had a murmur her whole life. PCP records reviewed, also state hx of unrepaired VSD. On exam she has a soft systolic murmur, she denies DOE, angina, LE edema, orthopnea, PND. EKG from PCP 01/23/15 showed sinus rhythm, rate 90, nonspecific ST changes. No indication for updated EKG at PAT. Copy on chart.)     Anesthesia Quick Evaluation

## 2018-07-22 ENCOUNTER — Inpatient Hospital Stay (HOSPITAL_COMMUNITY): Payer: Medicare Other | Admitting: Anesthesiology

## 2018-07-22 ENCOUNTER — Encounter (HOSPITAL_COMMUNITY): Payer: Self-pay

## 2018-07-22 ENCOUNTER — Inpatient Hospital Stay (HOSPITAL_COMMUNITY)
Admission: RE | Admit: 2018-07-22 | Discharge: 2018-07-27 | DRG: 494 | Disposition: A | Discharge status: Skilled Nursing Facility | Payer: Medicare Other | Attending: Orthopedic Surgery | Admitting: Orthopedic Surgery

## 2018-07-22 ENCOUNTER — Inpatient Hospital Stay (HOSPITAL_COMMUNITY): Payer: Medicare Other | Admitting: Physician Assistant

## 2018-07-22 ENCOUNTER — Other Ambulatory Visit: Payer: Self-pay

## 2018-07-22 ENCOUNTER — Encounter (HOSPITAL_COMMUNITY)
Admission: RE | Disposition: A | Discharge status: Skilled Nursing Facility | Payer: Self-pay | Source: Home / Self Care | Attending: Orthopedic Surgery

## 2018-07-22 DIAGNOSIS — R410 Disorientation, unspecified: Secondary | ICD-10-CM | POA: Diagnosis not present

## 2018-07-22 DIAGNOSIS — S9002XA Contusion of left ankle, initial encounter: Secondary | ICD-10-CM | POA: Diagnosis present

## 2018-07-22 DIAGNOSIS — Z91048 Other nonmedicinal substance allergy status: Secondary | ICD-10-CM

## 2018-07-22 DIAGNOSIS — K449 Diaphragmatic hernia without obstruction or gangrene: Secondary | ICD-10-CM | POA: Diagnosis present

## 2018-07-22 DIAGNOSIS — E039 Hypothyroidism, unspecified: Secondary | ICD-10-CM | POA: Diagnosis present

## 2018-07-22 DIAGNOSIS — M216X2 Other acquired deformities of left foot: Secondary | ICD-10-CM | POA: Diagnosis present

## 2018-07-22 DIAGNOSIS — S8264XA Nondisplaced fracture of lateral malleolus of right fibula, initial encounter for closed fracture: Secondary | ICD-10-CM | POA: Diagnosis present

## 2018-07-22 DIAGNOSIS — S82852A Displaced trimalleolar fracture of left lower leg, initial encounter for closed fracture: Secondary | ICD-10-CM | POA: Diagnosis present

## 2018-07-22 DIAGNOSIS — W19XXXA Unspecified fall, initial encounter: Secondary | ICD-10-CM | POA: Diagnosis present

## 2018-07-22 DIAGNOSIS — R011 Cardiac murmur, unspecified: Secondary | ICD-10-CM | POA: Diagnosis present

## 2018-07-22 DIAGNOSIS — S82852S Displaced trimalleolar fracture of left lower leg, sequela: Secondary | ICD-10-CM

## 2018-07-22 HISTORY — PX: ORIF ANKLE FRACTURE: SHX5408

## 2018-07-22 SURGERY — OPEN REDUCTION INTERNAL FIXATION (ORIF) ANKLE FRACTURE
Anesthesia: Monitor Anesthesia Care | Site: Ankle | Laterality: Left

## 2018-07-22 MED ORDER — DOCUSATE SODIUM 100 MG PO CAPS
100.0000 mg | ORAL_CAPSULE | Freq: Two times a day (BID) | ORAL | Status: DC
  Administered 2018-07-22 – 2018-07-27 (×10): 100 mg via ORAL
  Filled 2018-07-22 (×11): qty 1

## 2018-07-22 MED ORDER — HYDROMORPHONE HCL 1 MG/ML IJ SOLN
0.5000 mg | INTRAMUSCULAR | Status: DC | PRN
  Administered 2018-07-26: 0.5 mg via INTRAVENOUS
  Filled 2018-07-22: qty 1

## 2018-07-22 MED ORDER — THYROID 30 MG PO TABS
30.0000 mg | ORAL_TABLET | Freq: Every day | ORAL | Status: DC
  Administered 2018-07-22 – 2018-07-27 (×6): 30 mg via ORAL
  Filled 2018-07-22 (×6): qty 1

## 2018-07-22 MED ORDER — DEXMEDETOMIDINE HCL IN NACL 200 MCG/50ML IV SOLN
INTRAVENOUS | Status: AC
  Filled 2018-07-22: qty 50

## 2018-07-22 MED ORDER — DEXMEDETOMIDINE HCL IN NACL 200 MCG/50ML IV SOLN
INTRAVENOUS | Status: DC | PRN
  Administered 2018-07-22: 12 ug via INTRAVENOUS

## 2018-07-22 MED ORDER — ACETAMINOPHEN 325 MG PO TABS
325.0000 mg | ORAL_TABLET | Freq: Four times a day (QID) | ORAL | Status: DC | PRN
  Administered 2018-07-22 (×2): 325 mg via ORAL
  Administered 2018-07-24 – 2018-07-27 (×5): 650 mg via ORAL
  Filled 2018-07-22 (×6): qty 2
  Filled 2018-07-22: qty 1

## 2018-07-22 MED ORDER — VITAMIN D 25 MCG (1000 UNIT) PO TABS
1000.0000 [IU] | ORAL_TABLET | Freq: Every day | ORAL | Status: DC
  Administered 2018-07-22 – 2018-07-27 (×6): 1000 [IU] via ORAL
  Filled 2018-07-22 (×6): qty 1

## 2018-07-22 MED ORDER — PHENYLEPHRINE 40 MCG/ML (10ML) SYRINGE FOR IV PUSH (FOR BLOOD PRESSURE SUPPORT)
PREFILLED_SYRINGE | INTRAVENOUS | Status: AC
  Filled 2018-07-22: qty 10

## 2018-07-22 MED ORDER — LACTATED RINGERS IV SOLN
INTRAVENOUS | Status: DC | PRN
  Administered 2018-07-22: 07:00:00 via INTRAVENOUS

## 2018-07-22 MED ORDER — FENTANYL CITRATE (PF) 250 MCG/5ML IJ SOLN
INTRAMUSCULAR | Status: AC
  Filled 2018-07-22: qty 5

## 2018-07-22 MED ORDER — DEXAMETHASONE SODIUM PHOSPHATE 10 MG/ML IJ SOLN
INTRAMUSCULAR | Status: AC
  Filled 2018-07-22: qty 1

## 2018-07-22 MED ORDER — SODIUM CHLORIDE 0.9 % IV SOLN
INTRAVENOUS | Status: DC
  Administered 2018-07-22: 12:00:00 via INTRAVENOUS

## 2018-07-22 MED ORDER — ONDANSETRON HCL 4 MG PO TABS: 4.0000 mg | ORAL_TABLET | Freq: Four times a day (QID) | ORAL | Status: DC | PRN

## 2018-07-22 MED ORDER — ONDANSETRON HCL 4 MG/2ML IJ SOLN
INTRAMUSCULAR | Status: AC
  Filled 2018-07-22: qty 2

## 2018-07-22 MED ORDER — CHLORHEXIDINE GLUCONATE 4 % EX LIQD: 60.0000 mL | Freq: Once | CUTANEOUS | Status: DC

## 2018-07-22 MED ORDER — SODIUM CHLORIDE 0.9 % IV SOLN
INTRAVENOUS | Status: DC | PRN
  Administered 2018-07-22: 25 ug/min via INTRAVENOUS

## 2018-07-22 MED ORDER — OXYCODONE HCL 5 MG PO TABS
10.0000 mg | ORAL_TABLET | ORAL | Status: DC | PRN
  Filled 2018-07-22: qty 2

## 2018-07-22 MED ORDER — ACETAMINOPHEN 500 MG PO TABS: 1000.0000 mg | ORAL_TABLET | Freq: Once | ORAL | Status: DC

## 2018-07-22 MED ORDER — SENNA 8.6 MG PO TABS
1.0000 | ORAL_TABLET | Freq: Two times a day (BID) | ORAL | Status: DC
  Administered 2018-07-22 – 2018-07-27 (×10): 8.6 mg via ORAL
  Filled 2018-07-22 (×10): qty 1

## 2018-07-22 MED ORDER — CEFAZOLIN SODIUM-DEXTROSE 2-4 GM/100ML-% IV SOLN
2.0000 g | INTRAVENOUS | Status: AC
  Administered 2018-07-22: 2 g via INTRAVENOUS
  Filled 2018-07-22: qty 100

## 2018-07-22 MED ORDER — DIPHENHYDRAMINE HCL 12.5 MG/5ML PO ELIX: 12.5000 mg | ORAL_SOLUTION | ORAL | Status: DC | PRN

## 2018-07-22 MED ORDER — PROPOFOL 10 MG/ML IV BOLUS
INTRAVENOUS | Status: AC
  Filled 2018-07-22: qty 40

## 2018-07-22 MED ORDER — ONDANSETRON HCL 4 MG/2ML IJ SOLN
INTRAMUSCULAR | Status: DC | PRN
  Administered 2018-07-22: 4 mg via INTRAVENOUS

## 2018-07-22 MED ORDER — METHOCARBAMOL 1000 MG/10ML IJ SOLN
500.0000 mg | Freq: Four times a day (QID) | INTRAVENOUS | Status: DC | PRN
  Filled 2018-07-22: qty 5

## 2018-07-22 MED ORDER — FENTANYL CITRATE (PF) 250 MCG/5ML IJ SOLN
INTRAMUSCULAR | Status: DC | PRN
  Administered 2018-07-22 (×4): 25 ug via INTRAVENOUS

## 2018-07-22 MED ORDER — ENOXAPARIN SODIUM 40 MG/0.4ML ~~LOC~~ SOLN
40.0000 mg | SUBCUTANEOUS | Status: DC
  Administered 2018-07-23 – 2018-07-27 (×5): 40 mg via SUBCUTANEOUS
  Filled 2018-07-22 (×5): qty 0.4

## 2018-07-22 MED ORDER — PHENYLEPHRINE 40 MCG/ML (10ML) SYRINGE FOR IV PUSH (FOR BLOOD PRESSURE SUPPORT)
PREFILLED_SYRINGE | INTRAVENOUS | Status: DC | PRN
  Administered 2018-07-22: 120 ug via INTRAVENOUS

## 2018-07-22 MED ORDER — MIDAZOLAM HCL 2 MG/2ML IJ SOLN
INTRAMUSCULAR | Status: DC | PRN
  Administered 2018-07-22 (×2): 1 mg via INTRAVENOUS

## 2018-07-22 MED ORDER — MIDAZOLAM HCL 2 MG/2ML IJ SOLN
INTRAMUSCULAR | Status: AC
  Filled 2018-07-22: qty 2

## 2018-07-22 MED ORDER — FENTANYL CITRATE (PF) 100 MCG/2ML IJ SOLN: 25.0000 ug | INTRAMUSCULAR | Status: DC | PRN

## 2018-07-22 MED ORDER — SODIUM CHLORIDE 0.9 % IV SOLN: INTRAVENOUS | Status: DC

## 2018-07-22 MED ORDER — PROPOFOL 500 MG/50ML IV EMUL
INTRAVENOUS | Status: DC | PRN
  Administered 2018-07-22: 100 ug/kg/min via INTRAVENOUS

## 2018-07-22 MED ORDER — PROPOFOL 1000 MG/100ML IV EMUL
INTRAVENOUS | Status: AC
  Filled 2018-07-22: qty 200

## 2018-07-22 MED ORDER — ONDANSETRON HCL 4 MG/2ML IJ SOLN
4.0000 mg | Freq: Four times a day (QID) | INTRAMUSCULAR | Status: DC | PRN
  Administered 2018-07-23: 4 mg via INTRAVENOUS
  Filled 2018-07-22: qty 2

## 2018-07-22 MED ORDER — 0.9 % SODIUM CHLORIDE (POUR BTL) OPTIME
TOPICAL | Status: DC | PRN
  Administered 2018-07-22: 1000 mL

## 2018-07-22 MED ORDER — DEXAMETHASONE SODIUM PHOSPHATE 10 MG/ML IJ SOLN
INTRAMUSCULAR | Status: DC | PRN
  Administered 2018-07-22: 5 mg via INTRAVENOUS

## 2018-07-22 MED ORDER — OXYCODONE HCL 5 MG PO TABS
5.0000 mg | ORAL_TABLET | ORAL | Status: DC | PRN
  Administered 2018-07-23 (×2): 10 mg via ORAL
  Administered 2018-07-24: 5 mg via ORAL
  Filled 2018-07-22 (×3): qty 2

## 2018-07-22 MED ORDER — METHOCARBAMOL 500 MG PO TABS
500.0000 mg | ORAL_TABLET | Freq: Four times a day (QID) | ORAL | Status: DC | PRN
  Administered 2018-07-22: 250 mg via ORAL
  Administered 2018-07-23 – 2018-07-27 (×9): 500 mg via ORAL
  Filled 2018-07-22 (×10): qty 1

## 2018-07-22 SURGICAL SUPPLY — 64 items
ALCOHOL 70% 16 OZ (MISCELLANEOUS) ×3 IMPLANT
BANDAGE ESMARK 6X9 LF (GAUZE/BANDAGES/DRESSINGS) ×1 IMPLANT
BIT DRILL 2.5X2.75 QC CALB (BIT) ×2 IMPLANT
BIT DRILL 2.9 CANN QC NONSTRL (BIT) ×2 IMPLANT
BIT DRILL 3.5X5.5 QC CALB (BIT) ×2 IMPLANT
BLADE SURG 15 STRL LF DISP TIS (BLADE) ×1 IMPLANT
BLADE SURG 15 STRL SS (BLADE) ×3
BNDG CMPR 9X6 STRL LF SNTH (GAUZE/BANDAGES/DRESSINGS) ×1
BNDG COHESIVE 4X5 TAN STRL (GAUZE/BANDAGES/DRESSINGS) ×3 IMPLANT
BNDG COHESIVE 6X5 TAN STRL LF (GAUZE/BANDAGES/DRESSINGS) ×3 IMPLANT
BNDG ESMARK 6X9 LF (GAUZE/BANDAGES/DRESSINGS) ×3
CANISTER SUCT 3000ML PPV (MISCELLANEOUS) ×3 IMPLANT
CHLORAPREP W/TINT 26ML (MISCELLANEOUS) ×6 IMPLANT
COVER SURGICAL LIGHT HANDLE (MISCELLANEOUS) ×3 IMPLANT
COVER WAND RF STERILE (DRAPES) ×3 IMPLANT
CUFF TOURNIQUET SINGLE 34IN LL (TOURNIQUET CUFF) ×3 IMPLANT
CUFF TOURNIQUET SINGLE 44IN (TOURNIQUET CUFF) IMPLANT
DRAPE OEC MINIVIEW 54X84 (DRAPES) ×3 IMPLANT
DRAPE U-SHAPE 47X51 STRL (DRAPES) ×3 IMPLANT
DRSG MEPITEL 4X7.2 (GAUZE/BANDAGES/DRESSINGS) ×5 IMPLANT
DRSG PAD ABDOMINAL 8X10 ST (GAUZE/BANDAGES/DRESSINGS) ×6 IMPLANT
ELECT REM PT RETURN 9FT ADLT (ELECTROSURGICAL) ×3
ELECTRODE REM PT RTRN 9FT ADLT (ELECTROSURGICAL) ×1 IMPLANT
GAUZE SPONGE 4X4 12PLY STRL (GAUZE/BANDAGES/DRESSINGS) IMPLANT
GLOVE BIO SURGEON STRL SZ8 (GLOVE) ×1 IMPLANT
GLOVE BIOGEL PI IND STRL 8 (GLOVE) ×1 IMPLANT
GLOVE BIOGEL PI INDICATOR 8 (GLOVE) ×2
GLOVE ECLIPSE 8.0 STRL XLNG CF (GLOVE) ×1 IMPLANT
GLOVE SURG SS PI 8.0 STRL IVOR (GLOVE) ×4 IMPLANT
GOWN STRL REUS W/ TWL LRG LVL3 (GOWN DISPOSABLE) ×1 IMPLANT
GOWN STRL REUS W/ TWL XL LVL3 (GOWN DISPOSABLE) ×2 IMPLANT
GOWN STRL REUS W/TWL LRG LVL3 (GOWN DISPOSABLE) ×3
GOWN STRL REUS W/TWL XL LVL3 (GOWN DISPOSABLE) ×6
K-WIRE ACE 1.6X6 (WIRE) ×6
KIT BASIN OR (CUSTOM PROCEDURE TRAY) ×3 IMPLANT
KIT TURNOVER KIT B (KITS) ×3 IMPLANT
KWIRE ACE 1.6X6 (WIRE) IMPLANT
NS IRRIG 1000ML POUR BTL (IV SOLUTION) ×3 IMPLANT
PACK ORTHO EXTREMITY (CUSTOM PROCEDURE TRAY) ×3 IMPLANT
PAD ARMBOARD 7.5X6 YLW CONV (MISCELLANEOUS) ×6 IMPLANT
PAD CAST 4YDX4 CTTN HI CHSV (CAST SUPPLIES) ×1 IMPLANT
PADDING CAST COTTON 4X4 STRL (CAST SUPPLIES) ×3
PLATE ACE 100DE 10H (Plate) ×2 IMPLANT
SCREW ACE CAN 4.0 40M (Screw) ×6 IMPLANT
SCREW CORTICAL 3.5MM  16MM (Screw) ×6 IMPLANT
SCREW CORTICAL 3.5MM  20MM (Screw) ×2 IMPLANT
SCREW CORTICAL 3.5MM 14MM (Screw) ×4 IMPLANT
SCREW CORTICAL 3.5MM 16MM (Screw) IMPLANT
SCREW CORTICAL 3.5MM 20MM (Screw) IMPLANT
SCREW CORTICAL 3.5MM 22MM (Screw) ×2 IMPLANT
SPLINT PLASTER CAST XFAST 5X30 (CAST SUPPLIES) IMPLANT
SPLINT PLASTER XFAST SET 5X30 (CAST SUPPLIES) ×40
SPONGE LAP 18X18 RF (DISPOSABLE) ×3 IMPLANT
SUCTION FRAZIER HANDLE 10FR (MISCELLANEOUS) ×2
SUCTION TUBE FRAZIER 10FR DISP (MISCELLANEOUS) ×1 IMPLANT
SUT ETHILON 3 0 FSL (SUTURE) ×4 IMPLANT
SUT ETHILON 3 0 PS 1 (SUTURE) ×3 IMPLANT
SUT MNCRL AB 3-0 PS2 18 (SUTURE) IMPLANT
SUT VIC AB 2-0 CT1 27 (SUTURE) ×6
SUT VIC AB 2-0 CT1 TAPERPNT 27 (SUTURE) ×2 IMPLANT
TOWEL OR 17X24 6PK STRL BLUE (TOWEL DISPOSABLE) ×3 IMPLANT
TOWEL OR 17X26 10 PK STRL BLUE (TOWEL DISPOSABLE) ×3 IMPLANT
TUBE CONNECTING 12'X1/4 (SUCTIONS) ×1
TUBE CONNECTING 12X1/4 (SUCTIONS) ×2 IMPLANT

## 2018-07-22 NOTE — Progress Notes (Signed)
Pt's sister-in-law came up to this RN and informed me that pt has some "mental issues". RN asked her to elaborate more and she stated that pt was seen by Dr. Once and Dr. Though pt might have "schizophrenia" but that pt would never let staff know this and refused to take medications for this. Sister-in-law also informs me that pt has not been taking her thyroid medicine for months now and also that pt has been throwing up when eating and taking medications attributing this to missing back teeth. She also states that pt can have "bad mood swings" being happy one minute and very rude the next. RN thanked sister-in-law for information and told her I would make a note of it in her chart and that if we needed anything here at the hospital we would call her.

## 2018-07-22 NOTE — H&P (Signed)
Emily Berger is an 73 y.o. female.   Chief Complaint:  Left ankle pain HPI:  72 y/o female without significant PMH fell last week injuring both ankles.  She underwent closed reduction and splinting of a displaced trimal ankle fracture dislocation on the left and a nondisplaced Weber B lateral mal fracture on the right.  She presents today for open treatment of the left ankle fracture with internal dislocation.  Past Medical History:  Diagnosis Date  . Broken legs    bialteral  . 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 Heart murmur    slight  . History of hiatal hernia   . Hypothyroidism   . PONV (postoperative nausea and vomiting)     Past Surgical History:  Procedure Laterality Date  . ABDOMINAL HYSTERECTOMY    . APPENDECTOMY    . CARDIAC CATHETERIZATION     8x as a child  . CHOLECYSTECTOMY    . NASAL SEPTUM SURGERY    . TONSILLECTOMY    . TOOTH EXTRACTION      History reviewed. No pertinent family history. Social History:  reports that she has never smoked. She has never used smokeless tobacco. She reports that she does not drink alcohol or use drugs.  Allergies:  Allergies  Allergen Reactions  . Latex Other (See Comments)    Patient states she has not had a reaction, but that she would like to err on the side of caution.    Medications Prior to Admission  Medication Sig Dispense Refill  . Calcium-Vitamin D-Vitamin K (CALCIUM SOFT CHEWS PO) Take 1 tablet by mouth 2 (two) times daily.    . cholecalciferol (VITAMIN D3) 25 MCG (1000 UT) tablet Take 1,000 Units by mouth daily.    Marland Kitchen HYDROcodone-acetaminophen (NORCO/VICODIN) 5-325 MG tablet Take 1 tablet by mouth every 6 (six) hours as needed for moderate pain.    Marland Kitchen morphine (MSIR) 15 MG tablet Take 7.5 mg by mouth every 4 (four) hours as needed for severe pain.    . naproxen sodium (ALEVE) 220 MG tablet Take 220 mg by mouth 3 (three)  times daily.    Marland Kitchen thyroid (ARMOUR THYROID) 30 MG tablet Take 30 mg by mouth daily before breakfast.      Results for orders placed or performed during the hospital encounter of 07/20/18 (from the past 48 hour(s))  Surgical pcr screen     Status: None   Collection Time: 07/20/18  4:00 PM  Result Value Ref Range   MRSA, PCR NEGATIVE NEGATIVE   Staphylococcus aureus NEGATIVE NEGATIVE    Comment: (NOTE) The Xpert SA Assay (FDA approved for NASAL specimens in patients 38 years of age and older), is one component of a comprehensive surveillance program. It is not intended to diagnose infection nor to guide or monitor treatment. Performed at [facility], [address] [place], [place] [address]   Basic metabolic panel     Status: None   Collection Time: 07/20/18  4:05 PM  Result Value Ref Range   Sodium 135 135 - 145 mmol/L   Potassium 3.5 3.5 - 5.1 mmol/L   Chloride 100 98 - 111 mmol/L   CO2 26 22 - 32 mmol/L   Glucose, Bld 95 70 - 99 mg/dL   BUN 9 8 - 23 mg/dL   Creatinine, Ser 7.59 0.44 - 1.00 mg/dL   Calcium 9.3 8.9 - 10.3  mg/dL   GFR calc non Af Amer >60 >60 mL/min   GFR calc Af Amer >60 >60 mL/min   Anion gap 9 5 - 15    Comment: Performed at [facility], [address], [place], [place] [address]  CBC     Status: None   Collection Time: 07/20/18  4:05 PM  Result Value Ref Range   WBC 9.3 4.0 - 10.5 K/uL   RBC 4.42 3.87 - 5.11 MIL/uL   Hemoglobin 12.5 12.0 - 15.0 g/dL   HCT 50.3 54.6 - 56.8 %   MCV 86.9 80.0 - 100.0 fL   MCH 28.3 26.0 - 34.0 pg   MCHC 32.6 30.0 - 36.0 g/dL   RDW 12.7 51.7 - 00.1 %   Platelets 357 150 - 400 K/uL   nRBC 0.0 0.0 - 0.2 %    Comment: Performed at [facility], [address], [place], [place] [address]   No results found.  ROS  No recent f/c/n/v/wt loss  Blood pressure 135/70, pulse 94, temperature 98.8 F (37.1 C), temperature source Oral, height 5\' 6"  (1.676 m), weight 61.2 kg, SpO2 99 %. Physical Exam  wn wd  woman in nad.  A and ox  4.  Mood and affect normal.  EOMI.  resp unlabored.  L ankle splinted.  Brisk cap refill at the toes.  sens to LT intact dorsally and plantarly.  5/5 strength in PF and DF of the toes.  R ankle with cam boot in place.  Assessment/Plan L ankle trimal fracture dislocation - to OR for ORIF.  The risks and benefits of the alternative treatment options have been discussed in detail.  The patient wishes to proceed with surgery and specifically understands risks of bleeding, infection, nerve damage, blood clots, need for additional surgery, amputation and death.  R ankle nondisplaced lat mal fracture - closed treatment.  WBAT in cam boot.    Emily Arthurs, MD 07/22/2018, 7:27 AM

## 2018-07-22 NOTE — Discharge Instructions (Addendum)
Toni Arthurs, MD [facility]  Please read the following information regarding your care after surgery.  Medications  You only need a prescription for the narcotic pain medicine (ex. oxycodone, Percocet, Norco).  All of the other medicines listed below are available over the counter. X Aleve 2 pills twice a day for the first 3 days after surgery. X acetominophen (Tylenol) 650 mg every 4-6 hours as you need for minor to moderate pain X oxycodone as prescribed for severe pain X ok to take natural supplements (ie prune juice, calcium, etc)  Narcotic pain medicine (ex. oxycodone, Percocet, Vicodin) will cause constipation.  To prevent this problem, take the following medicines while you are taking any pain medicine. X docusate sodium (Colace) 100 mg twice a day X senna (Senokot) 2 tablets twice a day  X To help prevent blood clots, take a baby aspirin (81 mg) twice a day after surgery.  You should also get up every hour while you are awake to move around.    Weight Bearing X Do not bear any weight on the operated leg or foot. X Weight bear for transfers only in CAM boot on Right Lower extremity  Cast / Splint / Dressing X Keep your splint, cast or dressing clean and dry.  Dont put anything (coat hanger, pencil, etc) down inside of it.  If it gets damp, use a hair dryer on the cool setting to dry it.  If it gets soaked, call the office to schedule an appointment for a cast change.   After your dressing, cast or splint is removed; you may shower, but do not soak or scrub the wound.  Allow the water to run over it, and then gently pat it dry.  Swelling It is normal for you to have swelling where you had surgery.  To reduce swelling and pain, keep your toes above your nose for at least 3 days after surgery.  It may be necessary to keep your foot or leg elevated for several weeks.  If it hurts, it should be elevated.  Follow Up Call my office at [phone] when you are discharged  from the hospital or surgery center to schedule an appointment to be seen two weeks after surgery.  Call my office at [phone] if you develop a fever >101.5 F, nausea, vomiting, bleeding from the surgical site or severe pain.

## 2018-07-22 NOTE — Anesthesia Postprocedure Evaluation (Signed)
Anesthesia Post Note  Patient: Emily Berger  Procedure(s) Performed: Open reduction internal fixation left ankle trimalleolar fracture/dislocation (Left Ankle)     Patient location during evaluation: PACU Anesthesia Type: Regional and MAC Level of consciousness: awake and alert Pain management: pain level controlled Vital Signs Assessment: post-procedure vital signs reviewed and stable Respiratory status: spontaneous breathing, nonlabored ventilation, respiratory function stable and patient connected to nasal cannula oxygen Cardiovascular status: stable and blood pressure returned to baseline Postop Assessment: no apparent nausea or vomiting Anesthetic complications: no    Last Vitals:  Vitals:   07/22/18 1005 07/22/18 1031  BP: 111/64 (!) 97/51  Pulse: 71 77  Resp: 12 16  Temp: (!) 36.2 C (!) 36.3 C  SpO2: 100% 97%    Last Pain:  Vitals:   07/22/18 1605  TempSrc:   PainSc: 3                  [name] L [name]

## 2018-07-22 NOTE — Op Note (Signed)
07/22/2018  9:17 AM  PATIENT:  Emily Berger  73 y.o. female  PRE-OPERATIVE DIAGNOSIS: 1.  Displaced left ankle trimalleolar fracture      2.  Right ankle lateral malleolus fracture   POST-OPERATIVE DIAGNOSIS: 1.  Displaced left ankle trimalleolar fracture      2.  Left talus osteochondral defect      3.  Right ankle lateral malleolus fracture  Procedure(s): 1.  Open treatment of left displaced trimalleolar fracture with internal fixation and fixation of the posterior lip   2.  Debridement and microfracture of the left talus osteochondral defect   3.  Stress exam of left ankle under fluoro   4.   AP, lateral and mortise xrays of the left ankle   5.  Closed treatment of the right ankle lateral malleolus fracture without manipulation   6.  AP, mortise and lateral xrays of the right ankle  SURGEON:  Toni Arthurs, MD  ASSISTANT: Alfredo Martinez, PA-C  ANESTHESIA:   General, regional  EBL:  minimal   TOURNIQUET:  1:05@250  mm Hg  COMPLICATIONS:  None apparent  DISPOSITION:  Extubated, awake and stable to recovery.  INDICATION FOR PROCEDURE: The patient is a 73 year old female without significant past medical history.  She fell approximately a week ago injuring both ankles.  Radiographs in the emergency department revealed a nondisplaced fracture of the right ankle lateral malleolus.  On the left she was noted to have a trimalleolar fracture dislocation.  She underwent closed reduction and splinting of the left ankle and presents now for operative treatment of this displaced and unstable left ankle injury.  The risks and benefits of the alternative treatment options have been discussed in detail.  The patient wishes to proceed with surgery and specifically understands risks of bleeding, infection, nerve damage, blood clots, need for additional surgery, amputation and death.  PROCEDURE IN DETAIL:  After pre operative consent was obtained, and the correct operative site was identified, the  patient was brought to the operating room and placed supine on the OR table.  Anesthesia was administered.  Pre-operative antibiotics were administered.  A surgical timeout was taken.  The right lower extremity was then examined.  AP, mortise and lateral radiographs were obtained.  These show acceptable alignment of the fibular fracture.  The decision was made to proceed with closed treatment of this injury.  The patient's cam boot was reapplied.  The left lower extremity was then prepped and draped in standard sterile fashion with a tourniquet around the thigh.  The extremity was elevated and the tourniquet was inflated to 250 mmHg.  A longitudinal incision was made over the lateral malleolus.  Dissection was carried down through the subcutaneous tissues to the level of the periosteum.  The fracture site was identified.  It was mobilized and cleaned of all hematoma.  Attention was turned to the medial side of the ankle where a longitudinal incision was made.  Dissection was carried down through the subcutaneous tissues to the fracture site.  The fracture was cleaned of all hematoma.  All periosteum caught within the fracture site was debrided and excised.  The posterior malleolus fracture was mobilized from both the medial and lateral sides of the ankle allowing it to reduce appropriately.  The medial malleolus fracture was reduced and held with a tenaculum.  K wires were placed across the fracture site.  4 mm x 40 mm partially-threaded cannulated screws from the [employer] small frag set were inserted and both were noted to  have excellent purchase.  Attention was then turned to the lateral side of the ankle where the lateral malleolus fracture was reduced.  It was held with a tenaculum while a 3.5 mm fully threaded lag screw was inserted from posterior to anterior across the fracture site.  It was noted to compress the fracture site appropriately.  A 10 hole one third tubular plate was then contoured to fit the  lateral malleolus.  It was secured distally with 3 unicortical screws and proximally with 3 bicortical screws.  The posterior malleolus fracture fragment was then reduced and provisionally pinned.  A small incision was made anteriorly.  A 4 mm x 40 mm partially-threaded cannulated screw was inserted over the guidepin and was noted to scare the fracture fragment appropriately.  Final AP, lateral and mortise radiographs were obtained showing appropriate position and length of all hardware and appropriate reduction of all 3 fractures.  Stress examination was then performed revealing a stable syndesmosis and ankle mortise.  Wounds were irrigated copiously.  Deep subcutaneous tissues were approximated with Vicryl.  Superficial subcutaneous tissues were approximated with Monocryl.  Skin incisions were closed with horizontal mattress sutures of 3-0 nylon.  Sterile dressings were applied followed by well-padded short leg splint.  The tourniquet was released after application of the dressings.  Patient was awakened from anesthesia and transported to the recovery room in stable condition.  FOLLOW UP PLAN: The patient will be admitted for physical therapy and Occupational Therapy.  She will likely need placement in a skilled nursing facility for postop rehab.  She will be weightbearing as tolerated on the right lower extremity for transfers only.  Nonweightbearing on the left lower extremity.  Lovenox for DVT prophylaxis while an inpatient, and switch to aspirin 81 mg p.o. twice daily for discharge.  RADIOGRAPHS: 1.  AP, mortise and lateral radiographs of the right ankle are obtained intraoperatively.  These show a lateral malleolus Weber B fracture that is adequately reduced.  No other acute injuries are noted. 2.  AP, mortise and lateral radiographs of the left ankle are obtained intraoperatively.  These show interval reduction and fixation of medial, lateral and posterior malleolus fractures.  Hardware is  appropriately positioned and of the appropriate lengths.    Alfredo Martinez PA-C was present and scrubbed for the duration of the operative case. His assistance was essential in positioning the patient, prepping and draping, gaining and maintaining exposure, performing the operation, closing and dressing the wounds and applying the splint.

## 2018-07-22 NOTE — Evaluation (Addendum)
Physical Therapy Evaluation Patient Details Name: Emily Berger MRN: [id number] DOB: 11/30/1945 Today's Date: 07/22/2018   History of Present Illness  73 yo female s/p ORIF L ankle due to displaced trimalleloar fracture from fall. Pt also with R ankle nondisplaced lateral malleolar fracture in CAM boot, nonoperative at this time. PMH includes heart murmur, PONV, hiatial hernia.   Clinical Impression   Pt presents with difficulty performing mobility tasks, including bed mobility and transfers, s/p L ankle ORIF. Pt requires multiple verbal cues to maintain precautions at this time. Pt to benefit from acute PT to address deficits. Pt transferred to recliner at bedside with min assist for steadying, safety, and form this session. PT reinforced NWB LLE throughout as pt with tendency to try to place LLE on ground. Pt educated on quad sets  to perform this afternoon/evening to increase circulation, to pt's tolerance and limited by pain. Pt to d/c SNF upon d/c from acute setting at this time, to return to [facility] and regain independence with mobility. PT to progress mobility as tolerated, and will continue to follow acutely.    PT spoke with Dr. Victorino Dike on the phone, and per MD, pt should be transfer only with RLE at this time. Upon asking about pt's ability to use knee scooter as she was using PTA, Dr. Victorino Dike states he suspects this would be challenging for pt at this time using only "transfer weight" to propel self.       Follow Up Recommendations SNF;Supervision for mobility/OOB    Equipment Recommendations  Other (comment)(defer to next venue )    Recommendations for Other Services       Precautions / Restrictions Precautions Precautions: Fall Restrictions Weight Bearing Restrictions: Yes RLE Weight Bearing: Weight bearing as tolerated(with CAM boot, per op note transfer only. Called Dr. Laverta Baltimore office and spoke to the PA Register, who states maintain WBAT for transfers only until clarified with  Dr. Victorino Dike. ) LLE Weight Bearing: Non weight bearing      Mobility  Bed Mobility Overal bed mobility: Needs Assistance Bed Mobility: Supine to Sit     Supine to sit: HOB elevated;Min assist     General bed mobility comments: Min assist for LE translation to EOB, scooting to EOB. Increased time to perform.   Transfers Overall transfer level: Needs assistance Equipment used: Rolling walker (2 wheeled) Transfers: Stand Pivot Transfers;Sit to/from Stand Sit to Stand: Min assist;From elevated surface Stand pivot transfers: Min assist;From elevated surface       General transfer comment: Recliner placed to R of pt, perpendicular to bed in order to transfer to strong side. Min assist for steadying upon standing, reinforcing NWB on LLE. Min assist for guiding pt to recliner, instructing pt in pivot to recliner. Pt took a small step with RLE when translating toward chair. Verbal cuing for slow lowering, reaching UEs to recliner prior to sitting for safety.  Ambulation/Gait Ambulation/Gait assistance: (NT - unsure if pt is RLE WBAT for transfers only or for all mobility )              Stairs            Wheelchair Mobility    Modified Rankin (Stroke Patients Only)       Balance Overall balance assessment: Needs assistance Sitting-balance support: No upper extremity supported Sitting balance-Leahy Scale: Good     Standing balance support: Bilateral upper extremity supported Standing balance-Leahy Scale: Poor Standing balance comment: reliant on UE support at this time.  Pertinent Vitals/Pain Pain Assessment: No/denies pain    Home Living Family/patient expects to be discharged to:: Skilled nursing facility                      Prior Function Level of Independence: Needs assistance   Gait / Transfers Assistance Needed: Pt has been using knee scooter for mobility at home with daughter-in-law and son, assist for  transfers from daughter-in-law  ADL's / Homemaking Assistance Needed: Pt's daughter-in-law has been assisting pt with cooking, cleaning, dressing, and bathing since initial injury.   Comments: The scenario of pt fall was pt was at her church, missed a step, and landed poorly on LLE while her RLE caught her fall to some extent. Pt was on the ground for several minutes before brother who was mowing the lawn noticed her.      Hand Dominance   Dominant Hand: Right    Extremity/Trunk Assessment   Upper Extremity Assessment Upper Extremity Assessment: Overall WFL for tasks assessed    Lower Extremity Assessment Lower Extremity Assessment: Overall WFL for tasks assessed;LLE deficits/detail;RLE deficits/detail RLE Deficits / Details: In CAM boot in bed upon PT arrival to room. Able to perform SLR, quad set  RLE: Unable to fully assess due to immobilization LLE Deficits / Details: able to perform SLR, quad set  LLE: Unable to fully assess due to immobilization    Cervical / Trunk Assessment Cervical / Trunk Assessment: Normal  Communication   Communication: No difficulties  Cognition Arousal/Alertness: Awake/alert Behavior During Therapy: WFL for tasks assessed/performed Overall Cognitive Status: Within Functional Limits for tasks assessed                                 General Comments: Pt very talkative, somewhat forgetful during conversation this session      General Comments      Exercises     Assessment/Plan    PT Assessment Patient needs continued PT services  PT Problem List Decreased strength;Decreased mobility;Decreased safety awareness;Decreased range of motion;Decreased activity tolerance;Decreased balance       PT Treatment Interventions DME instruction;Functional mobility training;Gait training;Therapeutic activities;Neuromuscular re-education;Therapeutic exercise;Stair training;Balance training    PT Goals (Current goals can be found in the Care  Plan section)  Acute Rehab PT Goals Patient Stated Goal: return to active lifestyle PT Goal Formulation: With patient Time For Goal Achievement: 08/05/18 Potential to Achieve Goals: Good    Frequency Min 3X/week   Barriers to discharge        Co-evaluation               AM-PAC PT "6 Clicks" Mobility  Outcome Measure Help needed turning from your back to your side while in a flat bed without using bedrails?: A Little Help needed moving from lying on your back to sitting on the side of a flat bed without using bedrails?: A Little Help needed moving to and from a bed to a chair (including a wheelchair)?: A Little Help needed standing up from a chair using your arms (e.g., wheelchair or bedside chair)?: A Little Help needed to walk in hospital room?: A Lot Help needed climbing 3-5 steps with a railing? : A Lot 6 Click Score: 16    End of Session Equipment Utilized During Treatment: Other (comment);Gait belt(cam boot RLE ) Activity Tolerance: Patient tolerated treatment well Patient left: in chair;with chair alarm set;with family/visitor present;with call bell/phone within reach Nurse  Communication: Mobility status PT Visit Diagnosis: Other abnormalities of gait and mobility (R26.89);Difficulty in walking, not elsewhere classified (R26.2);History of falling (Z91.81)    Time: 1355-1430 PT Time Calculation (min) (ACUTE ONLY): 35 min   Charges:   PT Evaluation $PT Eval Low Complexity: 1 Low PT Treatments $Therapeutic Activity: 8-22 mins       Katriona Schmierer Terrial Rhodes, PT Acute Rehabilitation Services Pager [phone]  Office [phone]  Montie Gelardi D Despina Hidden 07/22/2018, 3:28 PM

## 2018-07-22 NOTE — Anesthesia Procedure Notes (Signed)
Anesthesia Regional Block: Popliteal block   Pre-Anesthetic Checklist: ,, timeout performed, Correct Patient, Correct Site, Correct Laterality, Correct Procedure, Correct Position, site marked, Risks and benefits discussed,  Surgical consent,  Pre-op evaluation,  At surgeon's request and post-op pain management  Laterality: Left  Prep: Maximum Sterile Barrier Precautions used, chloraprep       Needles:  Injection technique: Single-shot  Needle Type: Echogenic Stimulator Needle     Needle Length: 9cm  Needle Gauge: 22     Additional Needles:   Procedures:,,,, ultrasound used (permanent image in chart),,,,  Narrative:  Start time: 07/22/2018 7:12 AM End time: 07/22/2018 7:22 AM Injection made incrementally with aspirations every 5 mL.  Performed by: Personally  Anesthesiologist: Elmer Picker, MD  Additional Notes: Monitors applied. No increased pain on injection. No increased resistance to injection. Injection made in 5cc increments. Good needle visualization. Patient tolerated procedure well.

## 2018-07-22 NOTE — Anesthesia Procedure Notes (Signed)
Procedure Name: MAC Date/Time: 07/22/2018 7:35 AM Performed by: Alain Marion, CRNA Pre-anesthesia Checklist: Patient identified Oxygen Delivery Method: Simple face mask Placement Confirmation: positive ETCO2

## 2018-07-22 NOTE — Transfer of Care (Addendum)
Immediate Anesthesia Transfer of Care Note  Patient: Emily Berger  Procedure(s) Performed: Open reduction internal fixation left ankle trimalleolar fracture/dislocation (Left Ankle)  Patient Location: PACU  Anesthesia Type:MAC, Regional  Level of Consciousness: awake, alert  and oriented  Airway & Oxygen Therapy: Patient Spontanous Breathing and Patient connected to face mask oxygen  Post-op Assessment: Report given to RN and Post -op Vital signs reviewed and stable  Post vital signs: Reviewed and stable  Last Vitals:  Vitals Value Taken Time  BP 103/61 07/22/2018  9:33 AM  Temp    Pulse 71 07/22/2018  9:37 AM  Resp 9 07/22/2018  9:37 AM  SpO2 96 % 07/22/2018  9:37 AM  Vitals shown include unvalidated device data.  Last Pain:  Vitals:   07/22/18 0621  TempSrc:   PainSc: 0-No pain         Complications: No apparent anesthesia complications

## 2018-07-23 ENCOUNTER — Encounter (HOSPITAL_COMMUNITY): Payer: Self-pay | Admitting: Orthopedic Surgery

## 2018-07-23 MED ORDER — OXYCODONE HCL 5 MG PO TABS: 5.0000 mg | ORAL_TABLET | ORAL | 0 refills | Status: AC | PRN

## 2018-07-23 MED ORDER — SENNA 8.6 MG PO TABS: 2.0000 | ORAL_TABLET | Freq: Two times a day (BID) | ORAL | 0 refills | Status: DC

## 2018-07-23 MED ORDER — ASPIRIN EC 81 MG PO TBEC: 81.0000 mg | DELAYED_RELEASE_TABLET | Freq: Two times a day (BID) | ORAL | 0 refills | Status: DC

## 2018-07-23 MED ORDER — DOCUSATE SODIUM 100 MG PO CAPS: 100.0000 mg | ORAL_CAPSULE | Freq: Two times a day (BID) | ORAL | 0 refills | Status: DC

## 2018-07-23 NOTE — Progress Notes (Signed)
The patient is refusing to keep the bed alarm on for safety.  The bed alarm was explained to the patient. The patient appears paranoid and anxious at times.

## 2018-07-23 NOTE — Progress Notes (Signed)
OT Cancellation Note  Patient Details Name: Emily Berger MRN: [id number] DOB: 10/17/1945   Cancelled Treatment:    Reason Eval/Treat Not Completed: Patient declined, no reason specified  Fontaine No, OTR/L  Acute Rehabilitation Services Pager: [phone] Office: [phone] .  07/23/2018, 12:19 PM

## 2018-07-23 NOTE — Progress Notes (Signed)
Displaying traits of paranoid sczhiophrenia- questioning the tech as to which "cameras were watching them", while they were in the room. Then was showing her pictures she took on her phone of the room in the dark. Several of which are the profile of the computer on wheels; that she is convinced is a person in a witches outfit, watching her- she has slept very little for various reasones ; such as too much light, had staff cover the safety light on the wall. Along with labile periods, of crying. Staff have been in room at length to ease her anxiety. Doors to room left open to allow her the comfort of knowing people are close by/

## 2018-07-23 NOTE — Progress Notes (Signed)
Subjective: 1 Day Post-Op Procedure(s) (LRB): Open reduction internal fixation left ankle trimalleolar fracture/dislocation (Left)  Patient reports pain as mild.  Reports that block is still working.  Denies fever, chills, N/V, CP, SOB.  Tolerating POs well.  Admits to flatus.  States that she worked well with therapy.  Objective:   VITALS:  Temp:  [97 F (36.1 C)-98.6 F (37 C)] 98.1 F (36.7 C) (03/20 0130) Pulse Rate:  [71-91] 91 (03/20 0130) Resp:  [11-18] 15 (03/20 0130) BP: (97-141)/(51-91) 141/70 (03/20 0130) SpO2:  [93 %-100 %] 100 % (03/20 0130)  General: WDWN patient in NAD. Psych:  Appropriate mood and affect. Neuro:  A&O x 3, Moving all extremities, sensation subjectively diminished to light touch HEENT:  EOMs intact Chest:  Even non-labored respirations Skin:  SLS C/D/I, no rashes or lesions Extremities: warm/dry, no visible edema, erythema or echymosis.  No lymphadenopathy. Pulses: Popliteus 2+ MSK:  ROM: EHL/FHL intact, MMT: able to perform quad set    LABS Recent Labs    07/20/18 1605  HGB 12.5  WBC 9.3  PLT 357   Recent Labs    07/20/18 1605  NA 135  K 3.5  CL 100  CO2 26  BUN 9  CREATININE 0.62  GLUCOSE 95   No results for input(s): LABPT, INR in the last 72 hours.   Assessment/Plan: 1 Day Post-Op Procedure(s) (LRB): Open reduction internal fixation left ankle trimalleolar fracture/dislocation (Left)  NWB L LE WBAT for transfers only on R LE in CAM boot. Up with therapy. Scripts on chart. D/C to SNF when bed available. Plan for 2 week outpatient post-op visit with Dr. Victorino Dike.   Alfredo Martinez PA-C [facility] Office:  [phone]

## 2018-07-23 NOTE — Progress Notes (Signed)
PT Cancellation Note  Patient Details Name: Emily Berger MRN: [id number] DOB: [birth year]   Cancelled Treatment:    Reason Eval/Treat Not Completed: Patient declined, no reason specified Patient declined reporting "I feel weak" from sitting on BSC this morning. PT will continue to follow acutely.    Derek Mound, PTA Acute Rehabilitation Services Pager: [phone] Office: [phone]   07/23/2018, 10:13 AM

## 2018-07-23 NOTE — Plan of Care (Signed)

## 2018-07-23 NOTE — Progress Notes (Signed)
The patient has had periods of being paranoid and thinking that people are following her.  Nurse sat down at the bedside with her and attempted to talk to her and give reassurance.

## 2018-07-24 MED ORDER — TRAMADOL HCL 50 MG PO TABS
25.0000 mg | ORAL_TABLET | Freq: Four times a day (QID) | ORAL | Status: DC | PRN
  Administered 2018-07-24: 25 mg via ORAL
  Administered 2018-07-25 – 2018-07-27 (×7): 50 mg via ORAL
  Filled 2018-07-24 (×8): qty 1

## 2018-07-24 NOTE — Progress Notes (Signed)
In addition to previous note from this RN, Dr. Linna Caprice advised RN to discontinue narcotic administration. Nursing will continue to monitor.

## 2018-07-24 NOTE — Progress Notes (Signed)
RN called answering service requesting call back related to patient's mentation, confusion, and hallucinations.   Dr. Linna Caprice returned call and advised RN to administer Benadryl for symptom management.   RN spoke with patient's daughter-in-law Emily Berger (listed on POA form) during the 0008 hour. Emily Berger stated that her mother-in-law had behaviors very similar to this prior to surgery and it has only heightened the behaviors since surgery. States that she would like referral to specialist to have her mother-in-law's mentation further assessed.  Nursing will continue to monitor.

## 2018-07-24 NOTE — NC FL2 (Addendum)
[place] MEDICAID FL2 LEVEL OF CARE SCREENING TOOL     IDENTIFICATION  Patient Name: Emily Berger Birthdate: 11-25-1945 Sex: female Admission Date (Current Location): 07/22/2018  [facility] and [place] Number:  Producer, television/film/video and Address:  [facility], [address] [place], [place] [address]      Provider Number: [id number]  Attending Physician Name and Address:  Toni Arthurs, MD  Relative Name and Phone Number:       Current Level of Care: Hospital Recommended Level of Care: Skilled Nursing Facility Prior Approval Number:    Date Approved/Denied:   PASRR Number: [id number]   Discharge Plan: SNF    Current Diagnoses: Patient Active Problem List   Diagnosis Date Noted  . Trimalleolar fracture of ankle, closed, left, sequela 07/22/2018    Orientation RESPIRATION BLADDER Height & Weight     Time, Situation, Place, Self  Normal Continent Weight: 135 lb (61.2 kg) Height:  5\' 6"  (167.6 cm)  BEHAVIORAL SYMPTOMS/MOOD NEUROLOGICAL BOWEL NUTRITION STATUS      Continent Diet(regular diet, thin liquids)  AMBULATORY STATUS COMMUNICATION OF NEEDS Skin   Limited Assist Verbally Surgical wounds(Closed incision left leg, compression wrapped)                       Personal Care Assistance Level of Assistance  Bathing, Feeding, Dressing Bathing Assistance: Limited assistance Feeding assistance: Independent Dressing Assistance: Limited assistance     Functional Limitations Info  Sight, Hearing, Speech Sight Info: Adequate Hearing Info: Adequate Speech Info: Adequate    SPECIAL CARE FACTORS FREQUENCY  PT (By licensed PT), OT (By licensed OT)     PT Frequency: 5x OT Frequency: 5x            Contractures Contractures Info: Not present    Additional Factors Info  Code Status, Allergies Code Status Info: Full Code Allergies Info: Latex           Current Medications (07/24/2018):  This is the current hospital active  medication list Current Facility-Administered Medications  Medication Dose Route Frequency Provider Last Rate Last Dose  . 0.9 %  sodium chloride infusion   Intravenous Continuous Jacinta Shoe, [place] 75 mL/hr at 07/22/18 1629    . acetaminophen (TYLENOL) tablet 325-650 mg  325-650 mg Oral Q6H PRN Jacinta Shoe, PA-C   650 mg at 07/24/18 1423  . cholecalciferol (VITAMIN D3) tablet 1,000 Units  1,000 Units Oral Daily Jacinta Shoe, PA-C   1,000 Units at 07/24/18 5883  . diphenhydrAMINE (BENADRYL) 12.5 MG/5ML elixir 12.5-25 mg  12.5-25 mg Oral Q4H PRN Jacinta Shoe, PA-C      . docusate sodium (COLACE) capsule 100 mg  100 mg Oral BID Jacinta Shoe, PA-C   100 mg at 07/24/18 2549  . enoxaparin (LOVENOX) injection 40 mg  40 mg Subcutaneous Q24H Jacinta Shoe, PA-C   40 mg at 07/24/18 8264  . HYDROmorphone (DILAUDID) injection 0.5-1 mg  0.5-1 mg Intravenous Q4H PRN Jacinta Shoe, PA-C      . methocarbamol (ROBAXIN) tablet 500 mg  500 mg Oral Q6H PRN Jacinta Shoe, PA-C   500 mg at 07/24/18 1005   Or  . methocarbamol (ROBAXIN) 500 mg in dextrose 5 % 50 mL IVPB  500 mg Intravenous Q6H PRN Jacinta Shoe, PA-C      . ondansetron [facility]) tablet 4 mg  4 mg Oral Q6H PRN Jacinta Shoe, PA-C  Or  . ondansetron (ZOFRAN) injection 4 mg  4 mg Intravenous Q6H PRN Jacinta Shoe, PA-C   4 mg at 07/23/18 2026  . oxyCODONE (Oxy IR/ROXICODONE) immediate release tablet 10-15 mg  10-15 mg Oral Q4H PRN Jacinta Shoe, PA-C      . oxyCODONE (Oxy IR/ROXICODONE) immediate release tablet 5-10 mg  5-10 mg Oral Q4H PRN Jacinta Shoe, PA-C   5 mg at 07/24/18 0040  . senna (SENOKOT) tablet 8.6 mg  1 tablet Oral BID Jacinta Shoe, PA-C   8.6 mg at 07/24/18 7711  . thyroid (ARMOUR) tablet 30 mg  30 mg Oral QAC breakfast Jacinta Shoe, [place]   30 mg at 07/24/18 6579     Discharge Medications: Please see discharge summary for a list of discharge  medications.  Relevant Imaging Results:  Relevant Lab Results:   Additional Information SSN: [id number]  Maree Krabbe, LCSW

## 2018-07-24 NOTE — Progress Notes (Signed)
Subjective: 2 Days Post-Op Procedure(s) (LRB): Open reduction internal fixation left ankle trimalleolar fracture/dislocation (Left) Patient reports pain as mild. Reports a good night. Tolerating PO's. Denies CP or SOB. Confusion improved this morning.   Objective: Vital signs in last 24 hours: Temp:  [97.7 F (36.5 C)-98.9 F (37.2 C)] 98.9 F (37.2 C) (03/21 0514) Pulse Rate:  [78-102] 93 (03/21 0514) Resp:  [16] 16 (03/20 1604) BP: (106-121)/(52-67) 120/67 (03/21 0514) SpO2:  [96 %-98 %] 98 % (03/21 0514)  Intake/Output from previous day: 03/20 0701 - 03/21 0700 In: 480 [P.O.:480] Out: -  Intake/Output this shift: No intake/output data recorded.  No results for input(s): HGB in the last 72 hours. No results for input(s): WBC, RBC, HCT, PLT in the last 72 hours. No results for input(s): NA, K, CL, CO2, BUN, CREATININE, GLUCOSE, CALCIUM in the last 72 hours. No results for input(s): LABPT, INR in the last 72 hours.  Alert and oriented x3. RRR, Lungs clear, BS x4. Left Calf soft and non tender. L ankle splint and dressing C/D/I. No DVT signs. No signs of infection or compartment syndrome. LLE grossly neurovascularly intact. Cam boot on RLE.    Assessment/Plan: 2 Days Post-Op Procedure(s) (LRB): Open reduction internal fixation left ankle trimalleolar fracture/dislocation (Left) NWB LLE Limited WB RLE tranfers only N/V checks routine Waiting on SNF placement Confusion from last night improved this am Continue other current care F/u with Dr. Victorino Dike in 2 weeks after d/c      Emily Berger 07/24/2018, 8:02 AM

## 2018-07-24 NOTE — Evaluation (Signed)
Occupational Therapy Evaluation Patient Details Name: Emily Berger MRN: [id number] DOB: 1945/12/12 Today's Date: 07/24/2018    History of Present Illness 73 yo female s/p ORIF L ankle due to displaced trimalleloar fracture from fall. Pt also with R ankle nondisplaced lateral malleolar fracture in CAM boot, nonoperative at this time. PMH includes heart murmur, PONV, hiatial hernia.    Clinical Impression   Pt was using a knee scooter and her daughter in law was assisting with ADL and IADL since her fall. Pt non-receptive to education with regard to compensatory strategies, but does think through activities and transfers prior to attempting. Pt requires up to min assist for ADL and demonstrated squat-pivot transfer to [facility] with min guard assist. Will follow. Recommending SNF for continued rehab prior to return home. Pt is in agreement.    Follow Up Recommendations  SNF;Supervision/Assistance - 24 hour    Equipment Recommendations       Recommendations for Other Services       Precautions / Restrictions Precautions Precautions: Fall Restrictions Weight Bearing Restrictions: Yes RLE Weight Bearing: Weight bearing as tolerated LLE Weight Bearing: Non weight bearing      Mobility Bed Mobility Overal bed mobility: Needs Assistance Bed Mobility: Supine to Sit;Sit to Supine     Supine to sit: Supervision Sit to supine: Supervision   General bed mobility comments: no physical assist, supervision for safety  Transfers Overall transfer level: Needs assistance Equipment used: None Transfers: Squat Pivot Transfers     Squat pivot transfers: Min guard     General transfer comment: to and from [facility]    Balance Overall balance assessment: Needs assistance   Sitting balance-Leahy Scale: Good                                     ADL either performed or assessed with clinical judgement   ADL Overall ADL's : Needs assistance/impaired Eating/Feeding:  Independent;Bed level   Grooming: Wash/dry hands;Sitting;Set up   Upper Body Bathing: Set up;Sitting   Lower Body Bathing: Minimal assistance;Sitting/lateral leans   Upper Body Dressing : Set up;Sitting   Lower Body Dressing: Minimal assistance;Sitting/lateral leans   Toilet Transfer: Min guard;Squat-pivot;BSC   Toileting- Clothing Manipulation and Hygiene: Set up;Sitting/lateral lean               Vision Patient Visual Report: No change from baseline       Perception     Praxis      Pertinent Vitals/Pain Pain Assessment: Faces Faces Pain Scale: Hurts little more Pain Location: L LE Pain Descriptors / Indicators: Sore Pain Intervention(s): Monitored during session;Repositioned;Patient requesting pain meds-RN notified     Hand Dominance Left   Extremity/Trunk Assessment Upper Extremity Assessment Upper Extremity Assessment: Overall WFL for tasks assessed(arthritic changes in hands)   Lower Extremity Assessment Lower Extremity Assessment: Defer to PT evaluation RLE Deficits / Details: in CAM boot LLE: Unable to fully assess due to immobilization   Cervical / Trunk Assessment Cervical / Trunk Assessment: Normal   Communication Communication Communication: No difficulties   Cognition Arousal/Alertness: Awake/alert Behavior During Therapy: WFL for tasks assessed/performed Overall Cognitive Status: Within Functional Limits for tasks assessed                                 General Comments: pt very talkative and somewhat non-receptive to education, prefers to  do things her way   General Comments       Exercises     Shoulder Instructions      Home Living Family/patient expects to be discharged to:: Skilled nursing facility Living Arrangements: Children                                      Prior Functioning/Environment Level of Independence: Needs assistance  Gait / Transfers Assistance Needed: Pt has been using knee  scooter for mobility at home with daughter-in-law and son, assist for transfers from daughter-in-law ADL's / Homemaking Assistance Needed: Pt's daughter-in-law has been assisting pt with cooking, cleaning, dressing, and bathing since initial injury.    Comments: The scenario of pt fall was pt was at her church, missed a step, and landed poorly on LLE while her RLE caught her fall to some extent. Pt was on the ground for several minutes before brother who was mowing the lawn noticed her.         OT Problem List: Impaired balance (sitting and/or standing);Decreased safety awareness;Decreased knowledge of use of DME or AE;Pain      OT Treatment/Interventions: Self-care/ADL training;DME and/or AE instruction;Patient/family education;Balance training;Therapeutic activities    OT Goals(Current goals can be found in the care plan section) Acute Rehab OT Goals Patient Stated Goal: return to active lifestyle OT Goal Formulation: With patient Time For Goal Achievement: 08/07/18 Potential to Achieve Goals: Good ADL Goals Pt Will Perform Lower Body Bathing: with supervision;sitting/lateral leans Pt Will Perform Lower Body Dressing: with supervision;sit to/from stand Pt Will Transfer to Toilet: with supervision;squat pivot transfer;stand pivot transfer;bedside commode Pt Will Perform Toileting - Clothing Manipulation and hygiene: with supervision;sitting/lateral leans Additional ADL Goal #1: Pt will adhere to NWB precautions on L LE during ADL and transfers with supervision.  OT Frequency: Min 2X/week   Barriers to D/C:            Co-evaluation              AM-PAC OT "6 Clicks" Daily Activity     Outcome Measure Help from another person eating meals?: None Help from another person taking care of personal grooming?: None Help from another person toileting, which includes using toliet, bedpan, or urinal?: A Little Help from another person bathing (including washing, rinsing, drying)?: A  Little Help from another person to put on and taking off regular upper body clothing?: None Help from another person to put on and taking off regular lower body clothing?: A Little 6 Click Score: 21   End of Session Equipment Utilized During Treatment: Gait belt Nurse Communication: Patient requests pain meds  Activity Tolerance: Patient tolerated treatment well Patient left: in bed;with call bell/phone within reach;with bed alarm set  OT Visit Diagnosis: Pain;Unsteadiness on feet (R26.81) Pain - Right/Left: Left Pain - part of body: Ankle and joints of foot                Time: 2703-5009 OT Time Calculation (min): 43 min Charges:  OT General Charges $OT Visit: 1 Visit OT Evaluation $OT Eval Low Complexity: 1 Low OT Treatments $Self Care/Home Management : 23-37 mins  Martie Round, OTR/L Acute Rehabilitation Services Pager: [phone] Office: [phone]  Evern Bio 07/24/2018, 2:03 PM

## 2018-07-24 NOTE — Progress Notes (Signed)
Family member visiting pt and introduced herself as "one of" patient's HCPOA. Said person wanted to know what pain meds pt receiving. Did notify pt that her info not on file to disclose however was advised that pt's pain management was under control as evidenced by pt statements of relief. Prior to this family member entering room, pt states she feels narcotics were increasing her confusion and did not wish to take them. Did offer pt non-narcotic alternatives which pt stated relieved her pain. Shortly after visitor entered pt room, received report from NT that pt is questioning her medications. Went to speak to pt with visitor at bedside and pt gave permission to discuss her medications. Pt states the current regimen is not working for her and is requesting narcotics. Asked pt what changed her opinion on use of narcotics and was told "well it just makes more sense" and that her foot was "controlling what she is saying".  Pt then requested "half of" a vicodin instead of meds ordered. Page placed to on call provider.

## 2018-07-24 NOTE — Progress Notes (Signed)
New orders received for tramadol. Informed pt of new orders and pt in agreement with new pain management plan.

## 2018-07-24 NOTE — TOC Initial Note (Signed)
Transition of Care [facility]) - Initial/Assessment Note    Patient Details  Name: Emily Berger MRN: [id number] Date of Birth: 01-06-1946  Transition of Care [facility]) CM/SW Contact:    Maree Krabbe, LCSW Phone Number: 07/24/2018, 2:24 PM  Clinical Narrative:   Pt is agreeable to SNF however would like to discuss with family today when they come to the hospital. Pt asking that CSW follow up tomorrow regarding choice.                 Expected Discharge Plan: Skilled Nursing Facility Barriers to Discharge: Continued Medical Work up, English as a second language teacher   Patient Goals and CMS Choice Patient states their goals for this hospitalization and ongoing recovery are:: to return home after snf      Expected Discharge Plan and Services Expected Discharge Plan: Skilled Nursing Facility In-house Referral: Clinical Social Work     Living arrangements for the past 2 months: Single Family Home                          Prior Living Arrangements/Services Living arrangements for the past 2 months: Single Family Home Lives with:: Adult Children Patient language and need for interpreter reviewed:: No Do you feel safe going back to the place where you live?: Yes      Need for Family Participation in Patient Care: No (Comment) Care giver support system in place?: Yes (comment)   Criminal Activity/Legal Involvement Pertinent to Current Situation/Hospitalization: No - Comment as needed  Activities of Daily Living Home Assistive Devices/Equipment: Wheelchair, Eyeglasses ADL Screening (condition at time of admission) Patient's cognitive ability adequate to safely complete daily activities?: Yes Is the patient deaf or have difficulty hearing?: No Does the patient have difficulty seeing, even when wearing glasses/contacts?: No Does the patient have difficulty concentrating, remembering, or making decisions?: No Patient able to express need for assistance with ADLs?: Yes Does the patient have  difficulty dressing or bathing?: No Independently performs ADLs?: Yes (appropriate for developmental age) Does the patient have difficulty walking or climbing stairs?: Yes Weakness of Legs: Both Weakness of Arms/Hands: None  Permission Sought/Granted Permission sought to share information with : Facility Medical sales representative, Family Supports                Emotional Assessment Appearance:: Appears stated age Attitude/Demeanor/Rapport: (pt was appropriate) Affect (typically observed): Accepting, Appropriate, Calm Orientation: : Oriented to Self, Oriented to Place, Oriented to  Time, Oriented to Situation Alcohol / Substance Use: Not Applicable Psych Involvement: No (comment)  Admission diagnosis:  Displaced timalleolar fracture of left lower leg Patient Active Problem List   Diagnosis Date Noted  . Trimalleolar fracture of ankle, closed, left, sequela 07/22/2018   PCP:  Dois Davenport, MD Pharmacy:   [facility] [phone] [place], [place] - [address] AT [facility] [place] [address] & [facility] [address] [place] [address] Phone: [phone] Fax: [phone]     Social Determinants of Health (SDOH) Interventions    Readmission Risk Interventions No flowsheet data found.

## 2018-07-25 MED ORDER — MAGNESIUM CITRATE PO SOLN
0.5000 | Freq: Once | ORAL | Status: DC
  Filled 2018-07-25: qty 296

## 2018-07-25 MED ORDER — MAGNESIUM CITRATE PO SOLN
0.5000 | Freq: Once | ORAL | Status: AC
  Administered 2018-07-25: 0.5 via ORAL
  Filled 2018-07-25: qty 296

## 2018-07-25 NOTE — Progress Notes (Signed)
Attempted to give pt second half of the mag citrate ordered for her bowels. Pt stated that she wasn't going to take it. All that the first dose did was "make me nauseous and dizzy". I attempted to calm pt, but she said there "was no use wasting oxygen." Stated that she was going to complain on her survey because the blood pressure cuffs were too tight. Pt asked for another prune juice. NT took juice to pt. No acute distress noted. Pt refusing to take any more medication stating that "our bodies are not meant to have that many chemicals in them".

## 2018-07-25 NOTE — Progress Notes (Signed)
Patient ID: Emily Berger, female   DOB: [birth year], 73 y.o.   MRN: [id number] Subjective: 3 Days Post-Op Procedure(s) (LRB): Open reduction internal fixation left ankle trimalleolar fracture/dislocation (Left)    Patient reports pain as moderate particularly as block has worn off.   Otherwise she reviewed with me all that has transpired and plans  Objective:   VITALS:   Vitals:   07/24/18 1436 07/24/18 2100  BP: (!) 142/62 136/68  Pulse: 95 93  Resp:    Temp: 99.1 F (37.3 C) 98.8 F (37.1 C)  SpO2: 97% 98%    Sensation intact distally Incision: LLE splint C/D/I  Right LE in Cam Walker  LABS No results for input(s): HGB, HCT, WBC, PLT in the last 72 hours.  No results for input(s): NA, K, BUN, CREATININE, GLUCOSE in the last 72 hours.  No results for input(s): LABPT, INR in the last 72 hours.   Assessment/Plan: 3 Days Post-Op Procedure(s) (LRB): Open reduction internal fixation left ankle trimalleolar fracture/dislocation (Left)   Up with therapy Plan for discharge tomorrow Discharge to SNF - likely tomorrow No changes in plans Continue to work with pain control

## 2018-07-25 NOTE — Plan of Care (Signed)
  Problem: Activity: Goal: Risk for activity intolerance will decrease Outcome: Progressing   Problem: Coping: Goal: Level of anxiety will decrease Outcome: Progressing   Problem: Safety: Goal: Ability to remain free from injury will improve Outcome: Progressing   Problem: Skin Integrity: Goal: Risk for impaired skin integrity will decrease Outcome: Progressing   

## 2018-07-26 NOTE — Plan of Care (Signed)
  Problem: Coping: Goal: Level of anxiety will decrease Outcome: Progressing   Problem: Safety: Goal: Ability to remain free from injury will improve Outcome: Progressing   Problem: Skin Integrity: Goal: Risk for impaired skin integrity will decrease Outcome: Progressing   

## 2018-07-26 NOTE — Discharge Summary (Signed)
Physician Discharge Summary  Patient ID: DAYRIN SCOFIELD MRN: [id number] DOB/AGE: February 27, 1946 73 y.o.  Admit date: 07/22/2018 Discharge date: 07/27/2018  Admission Diagnoses: L ankle trimalleolar fx; R ankle nondisplaced fibula fx; hypothyroidism; hx of heart murmur, post-op nausea/vomiting, hiatal hernia.   Discharge Diagnoses:  Active Problems:   Trimalleolar fracture of ankle, closed, left, sequela same as above  Discharged Condition: stable  Hospital Course: Patient presented to [facility] OR on 07/22/18 for ORIF L ankle trimalleolar fx by Dr. Toni Arthurs.  The patient tolerated the procedure well without complication. The patient was then admitted to the hospital.  She worked well with therapy.  She tolerated her stay well.  She will be D/C'd to SNF on 07/27/18.  Consults: PT/OT; case manaement  Significant Diagnostic Studies: radiology: X-Ray: to ensure satisfactory aligment during operative procedure.  Treatments: IV hydration, antibiotics: Ancef, analgesia: acetaminophen, Vicodin and Dilaudid, anticoagulation: lovenox and surgery: as stated above.  Discharge Exam: Blood pressure 124/66, pulse 76, temperature 98.7 F (37.1 C), temperature source Oral, resp. rate 14, height 5\' 6"  (1.676 m), weight 61.2 kg, SpO2 100 %. General: WDWN patient in NAD. Psych:  Appropriate mood and affect. Neuro:  A&O x 3, Moving all extremities, sensation intact to light touch HEENT:  EOMs intact Chest:  Even non-labored respirations Skin: SLS on L LE and CAM boot on R LE C/D/I, no rashes or lesions Extremities: warm/dry, no visible edema, erythema or echymosis.  No lymphadenopathy. Pulses: Popliteus 2+ MSK:  ROM: EHL/FHL intact, MMT: able to perform quad set   Disposition: Discharge disposition: 03-Skilled Nursing Facility       Discharge Instructions    Call MD / Call 911   Complete by:  As directed    If you experience chest pain or shortness of breath, CALL 911 and be transported to  the hospital emergency room.  If you develope a fever above 101 F, pus (white drainage) or increased drainage or redness at the wound, or calf pain, call your surgeon's office.   Constipation Prevention   Complete by:  As directed    Drink plenty of fluids.  Prune juice may be helpful.  You may use a stool softener, such as Colace (over the counter) 100 mg twice a day.  Use MiraLax (over the counter) for constipation as needed.   Diet - low sodium heart healthy   Complete by:  As directed    Increase activity slowly as tolerated   Complete by:  As directed    Non weight bearing   Complete by:  As directed    Laterality:  left   Extremity:  Lower     Allergies as of 07/27/2018      Reactions   Latex Other (See Comments)   Patient states she has not had a reaction, but that she would like to err on the side of caution.      Medication List    STOP taking these medications   HYDROcodone-acetaminophen 5-325 MG tablet Commonly known as:  NORCO/VICODIN   morphine 15 MG tablet Commonly known as:  MSIR     TAKE these medications   Armour Thyroid 30 MG tablet Generic drug:  thyroid Take 30 mg by mouth daily before breakfast.   aspirin EC 81 MG tablet Take 1 tablet (81 mg total) by mouth 2 (two) times daily.   CALCIUM SOFT CHEWS PO Take 1 tablet by mouth 2 (two) times daily.   cholecalciferol 25 MCG (1000 UT) tablet Commonly known  as:  VITAMIN D3 Take 1,000 Units by mouth daily.   docusate sodium 100 MG capsule Commonly known as:  Colace Take 1 capsule (100 mg total) by mouth 2 (two) times daily. While taking narcotic pain medicine.   naproxen sodium 220 MG tablet Commonly known as:  ALEVE Take 220 mg by mouth 3 (three) times daily.   oxyCODONE 5 MG immediate release tablet Commonly known as:  Roxicodone Take 1 tablet (5 mg total) by mouth every 4 (four) hours as needed for up to 5 days for moderate pain or severe pain.   senna 8.6 MG Tabs tablet Commonly known as:   SENOKOT Take 2 tablets (17.2 mg total) by mouth 2 (two) times daily.            Discharge Care Instructions  (From admission, onward)         Start     Ordered   07/27/18 0000  Non weight bearing    Question Answer Comment  Laterality left   Extremity Lower      07/27/18 1214          Contact information for follow-up providers    Toni Arthurs, MD. Schedule an appointment as soon as possible for a visit in 2 weeks.   Specialty:  Orthopedic Surgery Contact information: [address] [place] [address] [place] [address] [phone]            Contact information for after-discharge care    Destination    [facility] .   Service:  Skilled Nursing Contact information: [address] [place] [address] [phone]                  Signed: Lolly Mustache Office:  [phone]

## 2018-07-26 NOTE — Progress Notes (Signed)
Physical Therapy Treatment Patient Details Name: Emily Berger MRN: [id number] DOB: 1946/03/18 Today's Date: 07/26/2018    History of Present Illness 73 yo female s/p ORIF L ankle due to displaced trimalleloar fracture from fall. Pt also with R ankle nondisplaced lateral malleolar fracture in CAM boot, nonoperative at this time. PMH includes heart murmur, PONV, hiatial hernia.     PT Comments    Patient seen for mobility progression. Pt is mod I for bed mobility and min guard for safety with functional transfers this session. Continue to progress as tolerated.    Follow Up Recommendations  SNF;Supervision for mobility/OOB     Equipment Recommendations  Other (comment)(defer to next venue )    Recommendations for Other Services       Precautions / Restrictions Precautions Precautions: Fall Restrictions Weight Bearing Restrictions: Yes RLE Weight Bearing: Weight bearing as tolerated(transfers only in CAM boot) LLE Weight Bearing: Non weight bearing    Mobility  Bed Mobility Overal bed mobility: Modified Independent Bed Mobility: Supine to Sit;Sit to Supine              Transfers Overall transfer level: Needs assistance Equipment used: Rolling walker (2 wheeled) Transfers: Stand Pivot Transfers Sit to Stand: Min guard Stand pivot transfers: Min guard       General transfer comment: cues for safe hand placement and use of AD; pt likes to pull on RW to stand despite cues not to and although this seems much harder for her  Ambulation/Gait                 Stairs             Wheelchair Mobility    Modified Rankin (Stroke Patients Only)       Balance Overall balance assessment: Needs assistance Sitting-balance support: No upper extremity supported Sitting balance-Leahy Scale: Good     Standing balance support: Single extremity supported Standing balance-Leahy Scale: Poor Standing balance comment: pt is able to stand with single UE support                              Cognition Arousal/Alertness: Awake/alert Behavior During Therapy: WFL for tasks assessed/performed Overall Cognitive Status: Within Functional Limits for tasks assessed                                 General Comments: pt very talkative and somewhat non-receptive to education, prefers to do things her way      Exercises      General Comments General comments (skin integrity, edema, etc.): pt declined sitting OOB in recliner      Pertinent Vitals/Pain Pain Assessment: Faces Faces Pain Scale: Hurts a little bit Pain Location: L LE Pain Descriptors / Indicators: Sore Pain Intervention(s): Limited activity within patient's tolerance;Monitored during session;Premedicated before session;Repositioned    Home Living                      Prior Function            PT Goals (current goals can now be found in the care plan section) Acute Rehab PT Goals Patient Stated Goal: return to active lifestyle Progress towards PT goals: Progressing toward goals    Frequency    Min 3X/week      PT Plan Current plan remains appropriate    Co-evaluation  AM-PAC PT "6 Clicks" Mobility   Outcome Measure  Help needed turning from your back to your side while in a flat bed without using bedrails?: A Little Help needed moving from lying on your back to sitting on the side of a flat bed without using bedrails?: A Little Help needed moving to and from a bed to a chair (including a wheelchair)?: A Little Help needed standing up from a chair using your arms (e.g., wheelchair or bedside chair)?: A Little Help needed to walk in hospital room?: A Little Help needed climbing 3-5 steps with a railing? : Total 6 Click Score: 16    End of Session Equipment Utilized During Treatment: Other (comment);Gait belt(cam boot RLE ) Activity Tolerance: Patient tolerated treatment well Patient left: with call bell/phone within  reach;in bed Nurse Communication: Mobility status PT Visit Diagnosis: Other abnormalities of gait and mobility (R26.89);Difficulty in walking, not elsewhere classified (R26.2);History of falling (Z91.81)     Time: 3086-5784 PT Time Calculation (min) (ACUTE ONLY): 19 min  Charges:  $Gait Training: 8-22 mins                     Emily Berger, PTA Acute Rehabilitation Services Pager: [phone] Office: [phone]     Carolynne Edouard 07/26/2018, 12:10 PM

## 2018-07-26 NOTE — Plan of Care (Signed)
  Problem: Education: Goal: Knowledge of General Education information will improve Description Including pain rating scale, medication(s)/side effects and non-pharmacologic comfort measures Outcome: Progressing   Problem: Health Behavior/Discharge Planning: Goal: Ability to manage health-related needs will improve Outcome: Progressing   

## 2018-07-26 NOTE — Progress Notes (Signed)
Subjective: 4 Days Post-Op Procedure(s) (LRB): Open reduction internal fixation left ankle trimalleolar fracture/dislocation (Left)  Patient reports pain as mild to moderate.  Tolerating POs well.  States that she would rather take all natural supplements, such as prune juice, then medications to promote bowel movement.  Denies fever, chills, N/V, CP, SOB.  Hopeful to be D/C'd to SNF today.  Objective:   VITALS:  Temp:  [98.4 F (36.9 C)-98.6 F (37 C)] 98.4 F (36.9 C) (03/23 0603) Pulse Rate:  [75-105] 75 (03/23 0603) Resp:  [14-18] 16 (03/23 0603) BP: (114-132)/(56-72) 114/72 (03/23 0603) SpO2:  [94 %-100 %] 98 % (03/23 0603)  General: WDWN patient in NAD. Psych:  Appropriate mood and affect. Neuro:  A&O x 3, Moving all extremities, sensation intact to light touch HEENT:  EOMs intact Chest:  Even non-labored respirations Skin:  CAM boot and SLS C/D/I, no rashes or lesions Extremities: warm/dry, no visible edema, erythema or echymosis.  No lymphadenopathy. Pulses: Popliteus 2+ MSK:  ROM: EHL/FHL intact, MMT: able to perform quad set    LABS No results for input(s): HGB, WBC, PLT in the last 72 hours. No results for input(s): NA, K, CL, CO2, BUN, CREATININE, GLUCOSE in the last 72 hours. No results for input(s): LABPT, INR in the last 72 hours.   Assessment/Plan: 4 Days Post-Op Procedure(s) (LRB): Open reduction internal fixation left ankle trimalleolar fracture/dislocation (Left)  NWB L LE WBAT for transfers only on R LE in CAM boot  Up with therapy D/C to SNF when bed available Scripts on chart.  Please inform me when patient is ready for D/C and I will place order.  Alfredo Martinez PA-C [facility] Office:  [phone]

## 2018-07-26 NOTE — Progress Notes (Signed)
Pt requests to discuss her medication regimen several times today. Did explain each time and wrote down medications and due times per pt request. Each time pt shows no evidence of recalling previous discussion. Was informed on pain meds PRN. Pt refuses tramadol stating it's not "good for" her and she doesn't want "too much of that stuff". Later pt states she needs something "stronger" for pain. Did attempt to clarify meds, regimen, and use once more. Encouraged to call for nurse when in need of pain meds.

## 2018-07-27 NOTE — Progress Notes (Signed)
[facility] came to transport patient however pt declined. Family member outside ready for pick up. AVS printed and explained, no further questions. IV d/c.

## 2018-07-27 NOTE — Progress Notes (Signed)
Patient declined amublant transport and upset regarding being transferred to SNF. CSW explained importance of patient getting to SNF before 3:30 pm in order to ensure their pharmacy is open to fill hard script pain meds, and that narcotics cannot be faxed. Patient continued to be upset and requested to speak to DON.   DON spoke with patient yesterday 3/23 as well as today regarding discharge. Patient continued to be upset. CSW phoned patient's daughter Larita Fife who spoke with patient regarding dc and offered to pick patient up.   At this time patient's family Larita Fife and Tresa Endo will be picking patient up to transport to [facility].   [facility] notified.   [place], [place] [phone]

## 2018-07-27 NOTE — Plan of Care (Signed)
  Problem: Education: Goal: Knowledge of General Education information will improve Description Including pain rating scale, medication(s)/side effects and non-pharmacologic comfort measures 07/27/2018 1545 by Janan Halter, RN Outcome: Progressing 07/27/2018 1544 by Janan Halter, RN Outcome: Progressing   Problem: Health Behavior/Discharge Planning: Goal: Ability to manage health-related needs will improve 07/27/2018 1545 by Janan Halter, RN Outcome: Progressing 07/27/2018 1544 by Janan Halter, RN Outcome: Progressing   Problem: Clinical Measurements: Goal: Ability to maintain clinical measurements within normal limits will improve 07/27/2018 1545 by Janan Halter, RN Outcome: Progressing 07/27/2018 1544 by Janan Halter, RN Outcome: Progressing Goal: Will remain free from infection 07/27/2018 1545 by Janan Halter, RN Outcome: Progressing 07/27/2018 1544 by Janan Halter, RN Outcome: Progressing Goal: Diagnostic test results will improve 07/27/2018 1545 by Janan Halter, RN Outcome: Progressing 07/27/2018 1544 by Janan Halter, RN Outcome: Progressing Goal: Respiratory complications will improve 07/27/2018 1545 by Janan Halter, RN Outcome: Progressing 07/27/2018 1544 by Janan Halter, RN Outcome: Progressing Goal: Cardiovascular complication will be avoided 07/27/2018 1545 by Janan Halter, RN Outcome: Progressing 07/27/2018 1544 by Janan Halter, RN Outcome: Progressing   Problem: Activity: Goal: Risk for activity intolerance will decrease 07/27/2018 1545 by Janan Halter, RN Outcome: Progressing 07/27/2018 1544 by Janan Halter, RN Outcome: Progressing   Problem: Nutrition: Goal: Adequate nutrition will be maintained 07/27/2018 1545 by Janan Halter, RN Outcome: Progressing 07/27/2018 1544 by Janan Halter, RN Outcome: Progressing   Problem: Coping: Goal: Level of anxiety will decrease 07/27/2018 1545 by Janan Halter, RN Outcome: Progressing 07/27/2018 1544 by Janan Halter, RN Outcome: Progressing   Problem: Elimination: Goal: Will not experience complications related to bowel motility 07/27/2018 1545 by Janan Halter, RN Outcome: Progressing 07/27/2018 1544 by Janan Halter, RN Outcome: Progressing Goal: Will not experience complications related to urinary retention 07/27/2018 1545 by Janan Halter, RN Outcome: Progressing 07/27/2018 1544 by Janan Halter, RN Outcome: Progressing   Problem: Safety: Goal: Ability to remain free from injury will improve 07/27/2018 1545 by Janan Halter, RN Outcome: Progressing 07/27/2018 1544 by Janan Halter, RN Outcome: Progressing   Problem: Skin Integrity: Goal: Risk for impaired skin integrity will decrease 07/27/2018 1545 by Janan Halter, RN Outcome: Progressing 07/27/2018 1544 by Janan Halter, RN Outcome: Progressing

## 2018-07-27 NOTE — Progress Notes (Signed)
Patient is A&Ox4, but has been having a few episodes of paranoia. Says that an object on the ceiling is a camera watching her and took pictures last night saying a person was sitting outside of her room shooting a laser at her, but it was the computer with the scanner. Will continue to monitor pt for any other abnormalities.

## 2018-07-27 NOTE — Progress Notes (Signed)
Report called and given to Alisa at [facility]. Spoke with Larita Fife family member per pt request. Nurse at [facility] request script for tramadol to be sent to [facility].

## 2018-07-27 NOTE — Progress Notes (Signed)
Subjective: 5 Days Post-Op Procedure(s) (LRB): Open reduction internal fixation left ankle trimalleolar fracture/dislocation (Left)  Patient reports pain as mild to moderate.  Tolerating POs well.  Admits to flatus.  Has worked well with therapy.  Reports that she is ready for D/C to SNF.  Objective:   VITALS:  Temp:  [97.9 F (36.6 C)-98.8 F (37.1 C)] 98.7 F (37.1 C) (03/24 0459) Pulse Rate:  [76-84] 76 (03/24 0459) Resp:  [14-16] 14 (03/24 0459) BP: (119-124)/(61-66) 124/66 (03/24 0459) SpO2:  [95 %-100 %] 100 % (03/24 0459)  General: WDWN patient in NAD. Psych:  Appropriate mood and affect. Neuro:  A&O x 3, Moving all extremities, sensation intact to light touch HEENT:  EOMs intact Chest:  Even non-labored respirations Skin: SLS C/D/I, no rashes or lesions Extremities: warm/dry, no visible edema, erythema or echymosis.  No lymphadenopathy. Pulses: Popliteus 2+ MSK:  ROM: EHL/FHL intact, MMT: able to perform quad set    LABS No results for input(s): HGB, WBC, PLT in the last 72 hours. No results for input(s): NA, K, CL, CO2, BUN, CREATININE, GLUCOSE in the last 72 hours. No results for input(s): LABPT, INR in the last 72 hours.   Assessment/Plan: 5 Days Post-Op Procedure(s) (LRB): Open reduction internal fixation left ankle trimalleolar fracture/dislocation (Left)  NWB L LE WBAT for transfer only on R LE in CAM boot D/C to SNF today Scripts on chart.  Alfredo Martinez PA-C [facility] Office:  [phone]

## 2018-07-27 NOTE — Progress Notes (Signed)
Physical Therapy Treatment Patient Details Name: Emily Berger MRN: [id number] DOB: 03-17-1946 Today's Date: 07/27/2018    History of Present Illness 73 yo female s/p ORIF L ankle due to displaced trimalleloar fracture from fall. Pt also with R ankle nondisplaced lateral malleolar fracture in CAM boot, nonoperative at this time. PMH includes heart murmur, PONV, hiatial hernia.     PT Comments    Continuing work on functional mobility and activity tolerance;  Ms. Pasek maintains NWB LLE quite well with basic transfers, keeping L foot in the air while pivot, hop-stepping on R foot with RW; Noting that she is resistant to education and cues for hand placement and trying different ways to move, or positions to be in to give her more repertoire of movement   Follow Up Recommendations  SNF;Supervision for mobility/OOB     Equipment Recommendations  Other (comment)(defer to next venue )    Recommendations for Other Services       Precautions / Restrictions Precautions Precautions: Fall Restrictions Weight Bearing Restrictions: Yes RLE Weight Bearing: Weight bearing as tolerated(in cam boot for transfers) LLE Weight Bearing: Non weight bearing    Mobility  Bed Mobility Overal bed mobility: Modified Independent Bed Mobility: Supine to Sit;Sit to Supine     Supine to sit: Supervision Sit to supine: Supervision   General bed mobility comments: no physical assist, supervision for safety  Transfers Overall transfer level: Needs assistance Equipment used: Rolling walker (2 wheeled) Transfers: Sit to/from [employer] Sit to Stand: Min assist Stand pivot transfers: Min guard       General transfer comment: Min assist to steady RW as she tends to pull up on it; cues for hand placement and safety; good maintenance of NWB with small pivot steps on R foot bed to [facility]; Better sit to stand from [facility], pushing off of both BSC armrests to stand and making good small steps  on R foot to pivot back to bed  Ambulation/Gait                 Stairs             Wheelchair Mobility    Modified Rankin (Stroke Patients Only)       Balance Overall balance assessment: Needs assistance Sitting-balance support: No upper extremity supported Sitting balance-Leahy Scale: Good     Standing balance support: Single extremity supported Standing balance-Leahy Scale: Fair Standing balance comment: pt is able to stand with single UE support                             Cognition Arousal/Alertness: Awake/alert Behavior During Therapy: WFL for tasks assessed/performed Overall Cognitive Status: Within Functional Limits for tasks assessed                                 General Comments: pt very talkative and somewhat non-receptive to education, prefers to do things her way      Exercises      General Comments General comments (skin integrity, edema, etc.): Very particular about placement of items in the room; opted to go back to bed to eat breakfast despite education re: benefits of being OOB and changing positions      Pertinent Vitals/Pain Pain Assessment: Faces Faces Pain Scale: Hurts a little bit Pain Location: L LE Pain Descriptors / Indicators: Sore Pain Intervention(s): Monitored during session  Home Living                      Prior Function            PT Goals (current goals can now be found in the care plan section) Acute Rehab PT Goals Patient Stated Goal: return to active lifestyle PT Goal Formulation: With patient Time For Goal Achievement: 08/05/18 Potential to Achieve Goals: Good Progress towards PT goals: Progressing toward goals    Frequency    Min 3X/week      PT Plan Current plan remains appropriate    Co-evaluation              AM-PAC PT "6 Clicks" Mobility   Outcome Measure  Help needed turning from your back to your side while in a flat bed without using  bedrails?: None Help needed moving from lying on your back to sitting on the side of a flat bed without using bedrails?: None Help needed moving to and from a bed to a chair (including a wheelchair)?: A Little Help needed standing up from a chair using your arms (e.g., wheelchair or bedside chair)?: A Little Help needed to walk in hospital room?: A Little Help needed climbing 3-5 steps with a railing? : Total 6 Click Score: 18    End of Session Equipment Utilized During Treatment: Gait belt Activity Tolerance: Patient tolerated treatment well Patient left: in bed;with call bell/phone within reach Nurse Communication: Mobility status PT Visit Diagnosis: Other abnormalities of gait and mobility (R26.89);Difficulty in walking, not elsewhere classified (R26.2);History of falling (Z91.81)     Time: 0838-0903(minus approx 5 minutes whil pt was on BSC) PT Time Calculation (min) (ACUTE ONLY): 25 min  Charges:  $Therapeutic Activity: 8-22 mins                     Van Clines, PT  Acute Rehabilitation Services Pager [phone] Office [phone]    Levi Aland 07/27/2018, 12:32 PM

## 2018-07-27 NOTE — Plan of Care (Signed)

## 2018-07-27 NOTE — Progress Notes (Signed)
Occupational Therapy Treatment Patient Details Name: Emily Berger MRN: [id number] DOB: 03/10/1946 Today's Date: 07/27/2018    History of present illness 73 yo female s/p ORIF L ankle due to displaced trimalleloar fracture from fall. Pt also with R ankle nondisplaced lateral malleolar fracture in CAM boot, nonoperative at this time. PMH includes heart murmur, PONV, hiatial hernia.    OT comments  Pt progressing toward OT goals. Pt required supervision bed mobility for simulated transfer to recliner. Min Guard functional transfer, and set up for oral care and grooming OOB in recliner. Pt non receptive of compensatory strategies or education. Pt prefers to do things her way however, very appreciative to engage in oral care while seated up in chair. DC recommendation and freqeuncy remains appropriate. OT will continue to follow acutely.   Follow Up Recommendations  SNF;Supervision/Assistance - 24 hour    Equipment Recommendations       Recommendations for Other Services      Precautions / Restrictions Precautions Precautions: Fall Restrictions Weight Bearing Restrictions: Yes RLE Weight Bearing: Weight bearing as tolerated LLE Weight Bearing: Non weight bearing       Mobility Bed Mobility Overal bed mobility: Modified Independent Bed Mobility: Supine to Sit;Sit to Supine     Supine to sit: Supervision Sit to supine: Supervision   General bed mobility comments: no physical assist, supervision for safety  Transfers Overall transfer level: Needs assistance Equipment used: Rolling walker (2 wheeled) Transfers: Stand Pivot Transfers Sit to Stand: Min guard Stand pivot transfers: Min guard       General transfer comment: pt required one cue to place one hand on RW and one on seated surface.     Balance Overall balance assessment: Needs assistance Sitting-balance support: No upper extremity supported Sitting balance-Leahy Scale: Good     Standing balance support:  Single extremity supported Standing balance-Leahy Scale: Poor Standing balance comment: pt is able to stand with single UE support                            ADL either performed or assessed with clinical judgement   ADL Overall ADL's : Needs assistance/impaired Eating/Feeding: Independent;Bed level   Grooming: Wash/dry hands;Sitting;Set up   Upper Body Bathing: Set up;Sitting               Toilet Transfer: Min guard;Stand-pivot;RW Toilet Transfer Details (indicate cue type and reason): simulated transfer from bed to recliner on R side of side.         Functional mobility during ADLs: Min guard;Rolling walker;Cueing for safety General ADL Comments: Pt demonstrated good transfer however required VCs for safety and hand placement.     Vision       Perception     Praxis      Cognition Arousal/Alertness: Awake/alert Behavior During Therapy: WFL for tasks assessed/performed Overall Cognitive Status: Within Functional Limits for tasks assessed                                 General Comments: pt very talkative and somewhat non-receptive to education, prefers to do things her way        Exercises     Shoulder Instructions       General Comments pt seated in chair to engage in grooming and oral care OOB, however complains that "her bottom hurts" OT repositioned for comfort.     Pertinent Vitals/  Pain       Pain Assessment: Faces Faces Pain Scale: Hurts a little bit Pain Location: L LE Pain Descriptors / Indicators: Sore Pain Intervention(s): Limited activity within patient's tolerance;Monitored during session;Repositioned  Home Living                                          Prior Functioning/Environment              Frequency  Min 2X/week        Progress Toward Goals  OT Goals(current goals can now be found in the care plan section)  Progress towards OT goals: Progressing toward goals  Acute Rehab  OT Goals Patient Stated Goal: return to active lifestyle OT Goal Formulation: With patient Time For Goal Achievement: 08/07/18 Potential to Achieve Goals: Good ADL Goals Pt Will Perform Lower Body Bathing: with supervision;sitting/lateral leans Pt Will Perform Lower Body Dressing: with supervision;sit to/from stand Pt Will Transfer to Toilet: with supervision;squat pivot transfer;stand pivot transfer;bedside commode Pt Will Perform Toileting - Clothing Manipulation and hygiene: with supervision;sitting/lateral leans Additional ADL Goal #1: Pt will adhere to NWB precautions on L LE during ADL and transfers with supervision.  Plan Discharge plan remains appropriate;Frequency remains appropriate    Co-evaluation                 AM-PAC OT "6 Clicks" Daily Activity     Outcome Measure   Help from another person eating meals?: None Help from another person taking care of personal grooming?: None Help from another person toileting, which includes using toliet, bedpan, or urinal?: A Little Help from another person bathing (including washing, rinsing, drying)?: A Little Help from another person to put on and taking off regular upper body clothing?: None Help from another person to put on and taking off regular lower body clothing?: A Little 6 Click Score: 21    End of Session Equipment Utilized During Treatment: Gait belt;Rolling walker  OT Visit Diagnosis: Pain;Unsteadiness on feet (R26.81) Pain - Right/Left: Left Pain - part of body: Ankle and joints of foot   Activity Tolerance Patient tolerated treatment well   Patient Left with call bell/phone within reach;in chair   Nurse Communication Mobility status;Weight bearing status        Time: 8485-9276 OT Time Calculation (min): 35 min  Charges: OT General Charges $OT Visit: 1 Visit OT Treatments $Self Care/Home Management : 23-37 mins  [place] Old, MSOT, OTR/L  Supplemental Rehabilitation  Services  [phone]   Zigmund Daniel 07/27/2018, 12:05 PM

## 2018-07-27 NOTE — TOC Transition Note (Signed)
Transition of Care [facility]) - CM/SW Discharge Note   Patient Details  Name: Emily Berger MRN: [id number] Date of Birth: 22-Jun-1945  [facility]) CM/SW Contact:  Gildardo Griffes, LCSW Phone Number: 07/27/2018, 1:20 PM   Clinical Narrative:     Patient will DC to: [facility] Anticipated DC date: 07/27/2018 Family notified: Larita Fife Transport by: Sharin Mons  Per MD patient ready for DC to [facility] . RN, patient, patient's family, and facility notified of DC. Discharge Summary sent to facility. RN given number for report [phone] ask for 300/400 hall nurse station. DC packet on chart. Ambulance transport requested for patient.  CSW signing off.  [place], [place] [phone]    Final next level of care: Skilled Nursing Facility Barriers to Discharge: No Barriers Identified   Patient Goals and CMS Choice Patient states their goals for this hospitalization and ongoing recovery are:: to go to rehab then go home CMS Medicare.gov Compare Post Acute Care list provided to:: Patient Represenative (must comment)(patient's daughter lynn) Choice offered to / list presented to : Adult Children  Discharge Placement PASRR number recieved: 07/24/18            Patient chooses bed at: Other - please specify in the comment section below:([facility]) Patient to be transferred to facility by: [facility] Name of family member notified: Larita Fife Patient and family notified of of transfer: 07/27/18  Discharge Plan and Services In-house Referral: Clinical Social Work                        Social Determinants of Health (SDOH) Interventions     Readmission Risk Interventions No flowsheet data found.

## 2018-07-28 ENCOUNTER — Encounter (HOSPITAL_COMMUNITY): Payer: Self-pay | Admitting: Emergency Medicine

## 2018-08-30 ENCOUNTER — Other Ambulatory Visit: Payer: Self-pay | Admitting: Family Medicine

## 2018-08-30 DIAGNOSIS — R5381 Other malaise: Secondary | ICD-10-CM

## 2018-08-31 ENCOUNTER — Other Ambulatory Visit: Payer: Self-pay | Admitting: Family Medicine

## 2018-08-31 DIAGNOSIS — M8580 Other specified disorders of bone density and structure, unspecified site: Secondary | ICD-10-CM

## 2020-09-16 IMAGING — DX DG ANKLE PORT 2V*L*
2 series · 2 of 2 positions shown · non-contrast
Comparison: July 10, 2018

CLINICAL DATA: Post reduction.

EXAM:
PORTABLE LEFT ANKLE - 2 VIEW

[ankle ap]
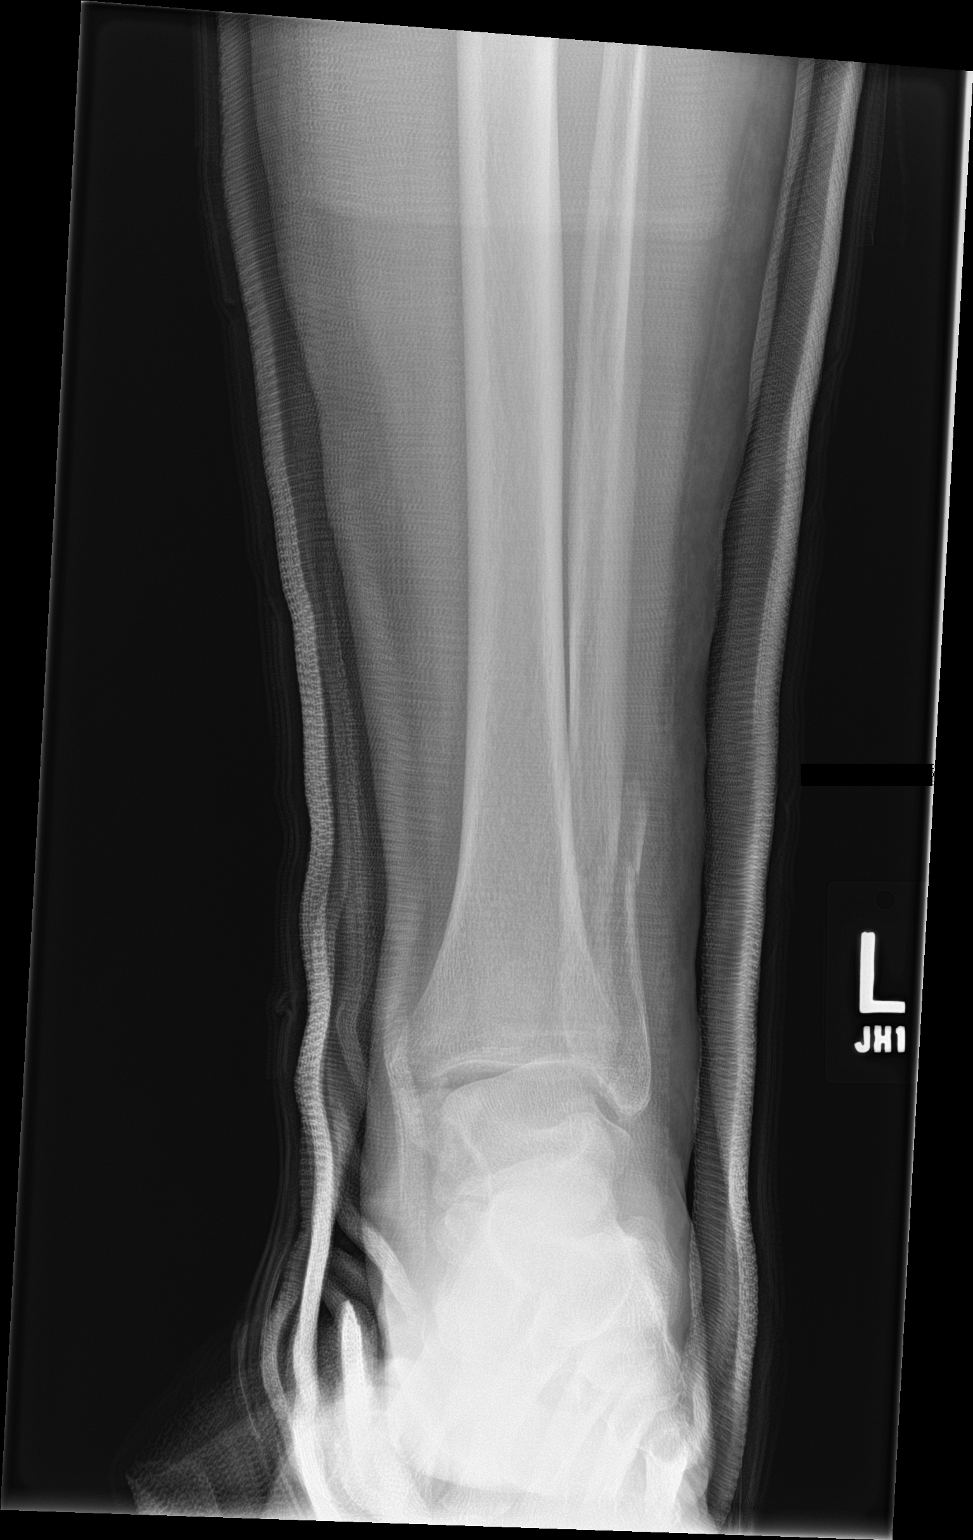

[ankle lat]
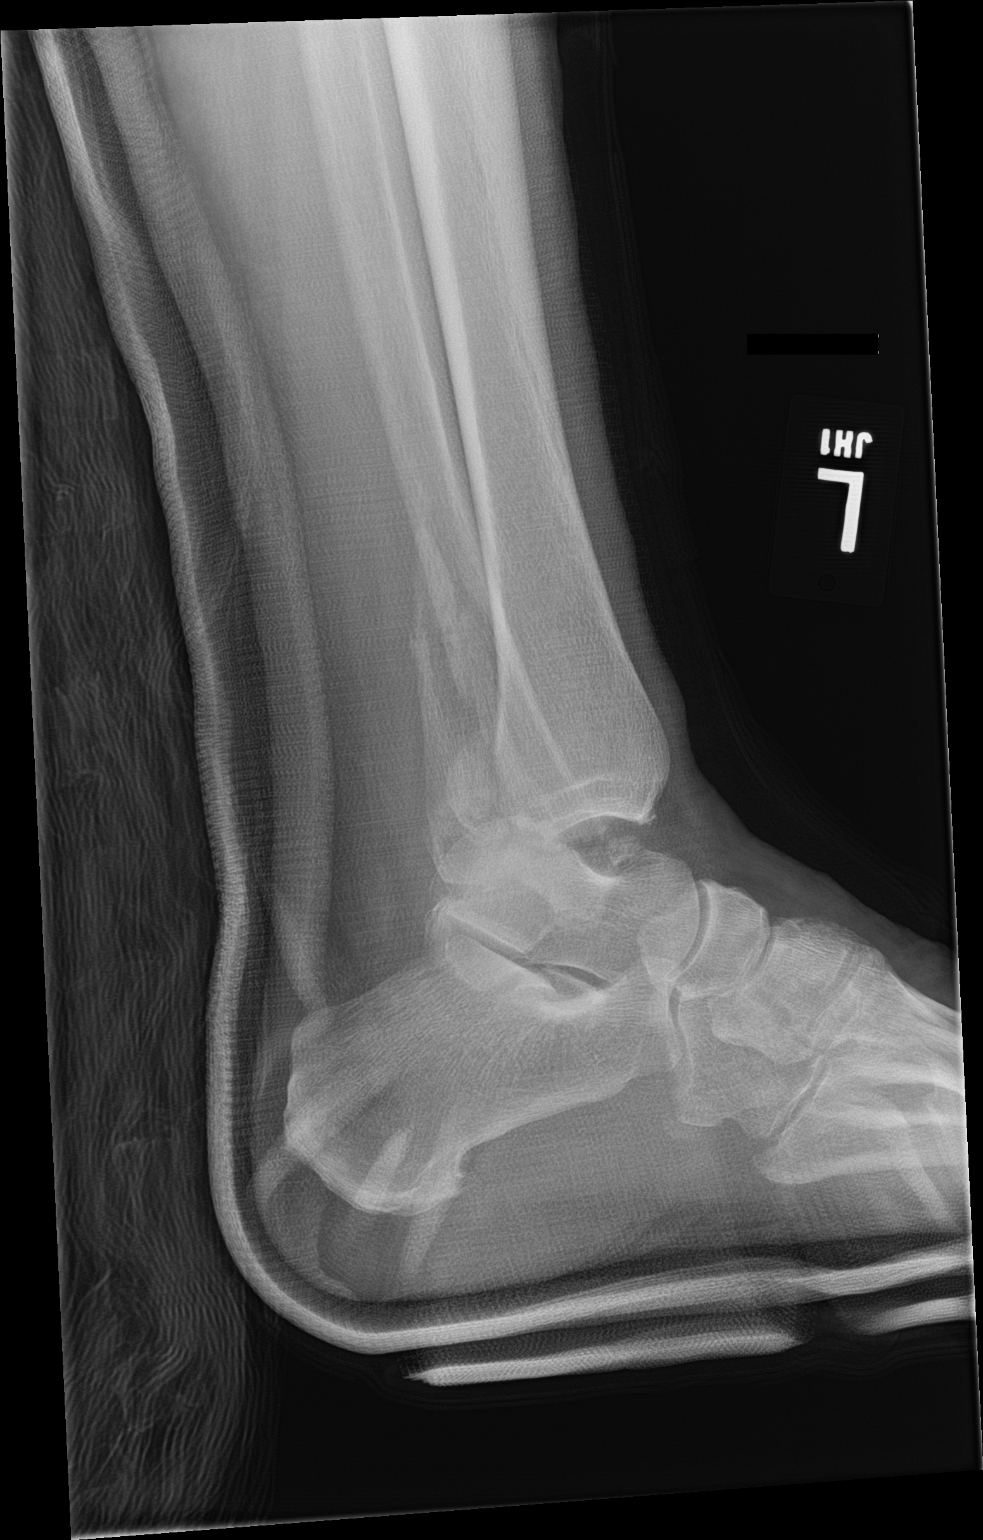

[2 of 2 positions shown; findings below may reference images not displayed]

FINDINGS: The patient's ankle fracture is been partially reduced. The tibia
still somewhat anteriorly displaced relative to the talus. The ankle
fractures trimalleolar. The patient is now in a cast.
IMPRESSION: Partial reduction of ankle fracture.

## 2021-02-21 IMAGING — DX DG ANKLE PORT 2V*R*
3 series · 3 of 3 positions shown · non-contrast
Comparison: None.

CLINICAL DATA: Right ankle swelling.

EXAM:
PORTABLE RIGHT ANKLE - 2 VIEW

[ankle ap]
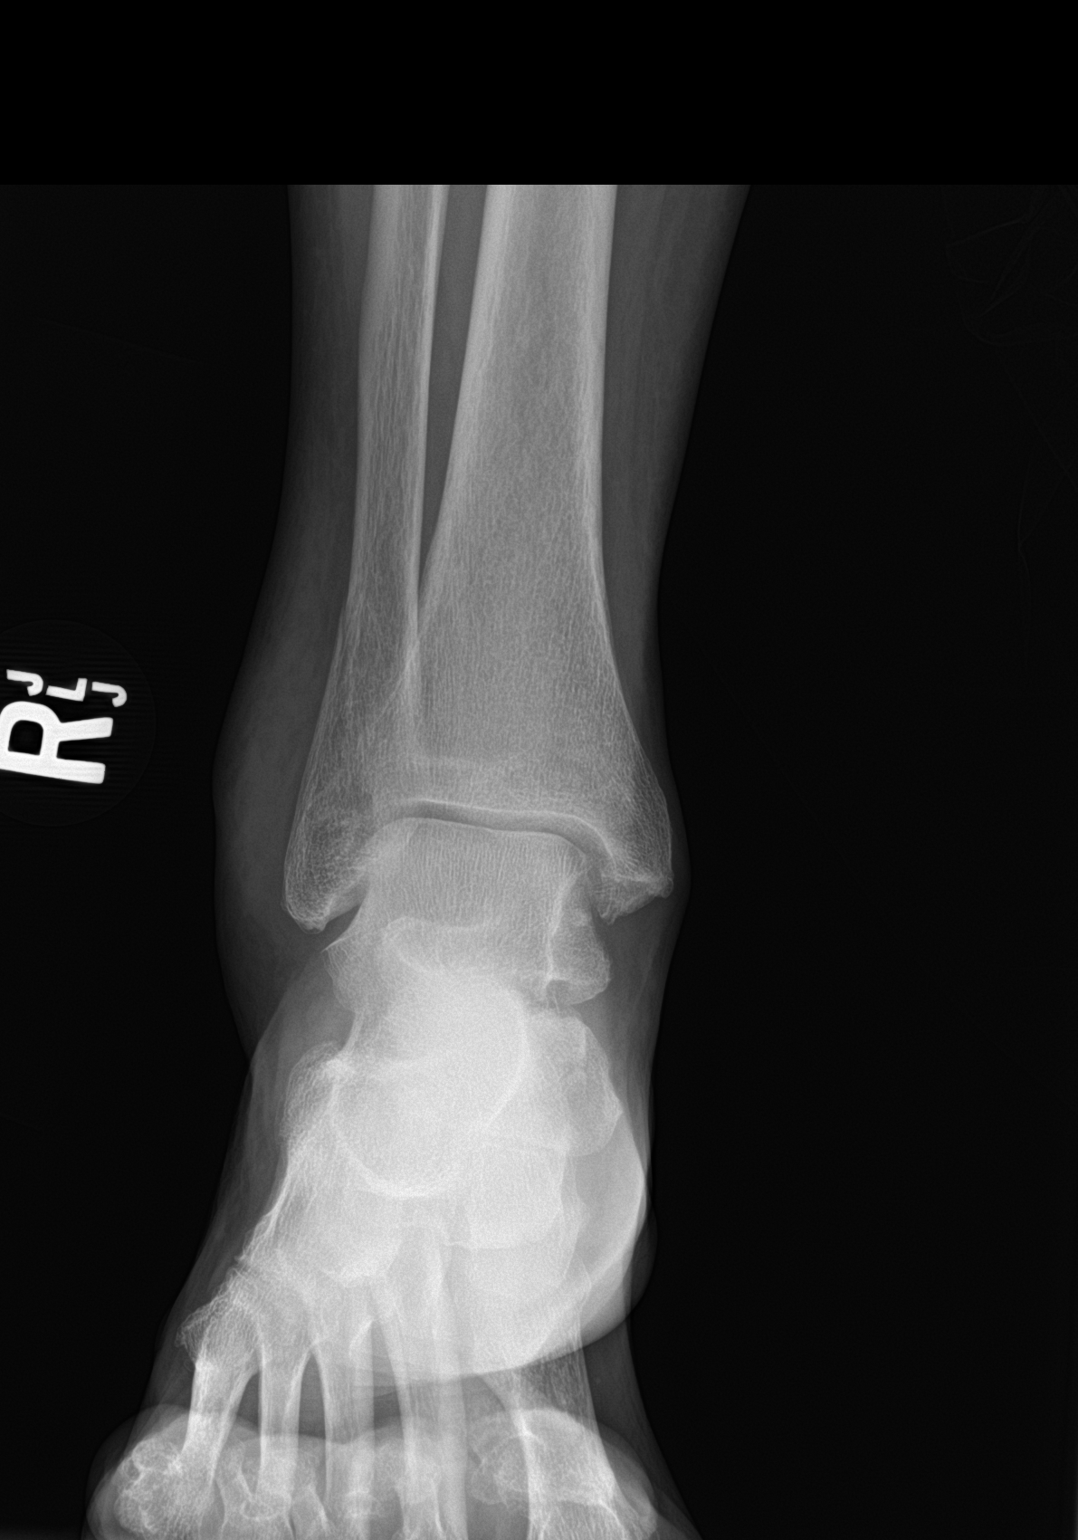

[ankle lat]
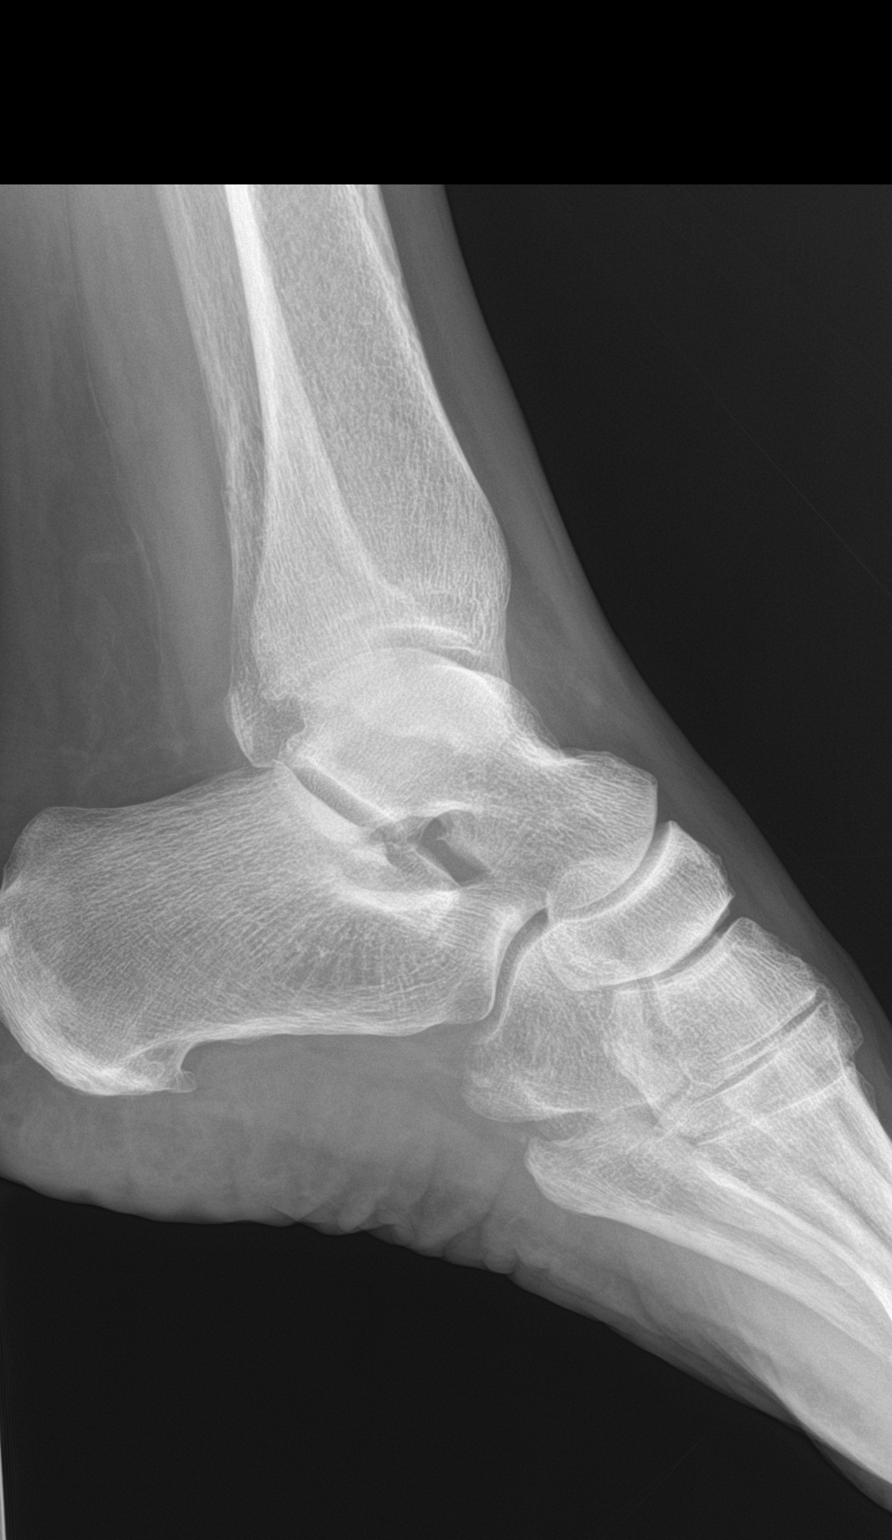

[ankle obl]
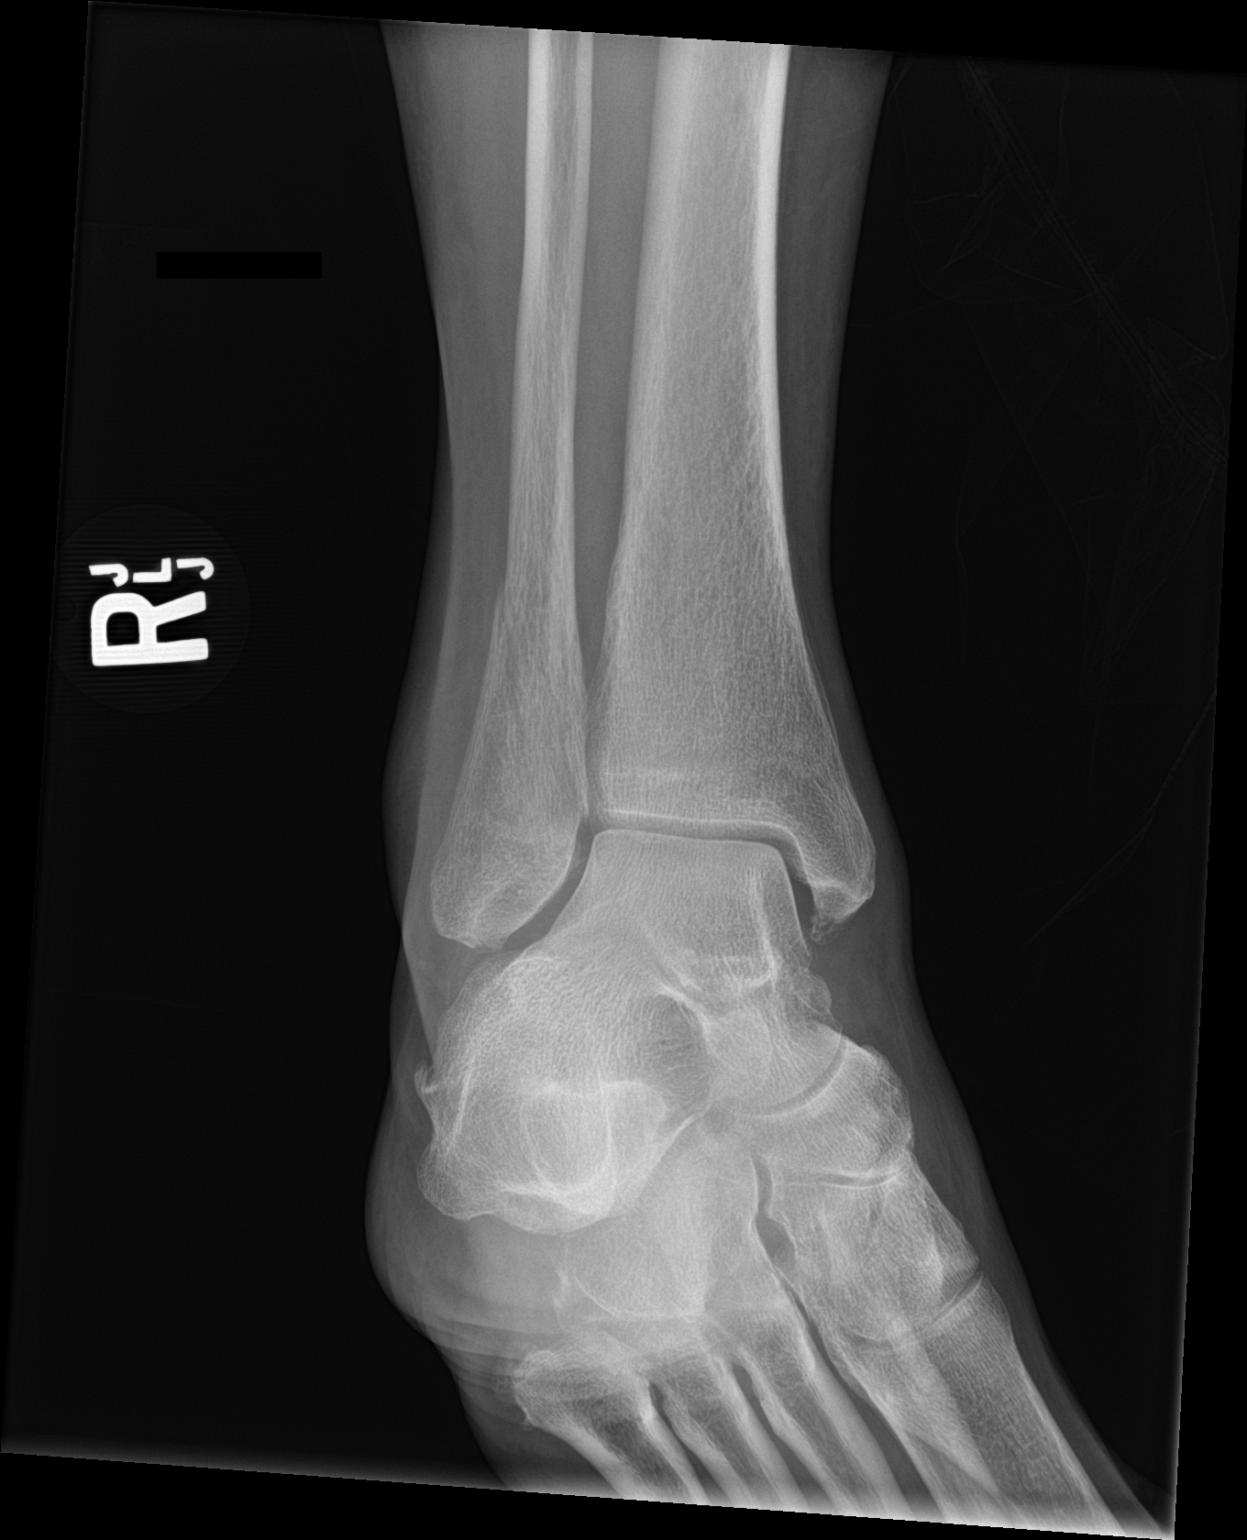

[3 of 3 positions shown; findings below may reference images not displayed]

FINDINGS: Oblique mildly displaced distal fibular fracture. No additional
fracture of the ankle. Ankle mortise is preserved. No ankle joint
effusion. Lateral soft tissue edema.
IMPRESSION: Oblique mildly displaced distal fibular fracture.

## 2022-10-06 ENCOUNTER — Other Ambulatory Visit: Payer: Self-pay | Admitting: Nurse Practitioner

## 2022-10-06 DIAGNOSIS — Z780 Asymptomatic menopausal state: Secondary | ICD-10-CM

## 2022-11-30 ENCOUNTER — Other Ambulatory Visit: Payer: Self-pay

## 2022-11-30 ENCOUNTER — Emergency Department (HOSPITAL_BASED_OUTPATIENT_CLINIC_OR_DEPARTMENT_OTHER): Payer: Medicare Other

## 2022-11-30 ENCOUNTER — Emergency Department (HOSPITAL_BASED_OUTPATIENT_CLINIC_OR_DEPARTMENT_OTHER)
Admission: EM | Admit: 2022-11-30 | Discharge: 2022-11-30 | Disposition: A | Discharge status: Home / Self Care | Payer: Medicare Other | Attending: Emergency Medicine | Admitting: Emergency Medicine

## 2022-11-30 ENCOUNTER — Encounter (HOSPITAL_BASED_OUTPATIENT_CLINIC_OR_DEPARTMENT_OTHER): Payer: Self-pay | Admitting: Emergency Medicine

## 2022-11-30 DIAGNOSIS — Z79890 Hormone replacement therapy (postmenopausal): Secondary | ICD-10-CM | POA: Insufficient documentation

## 2022-11-30 DIAGNOSIS — Z7982 Long term (current) use of aspirin: Secondary | ICD-10-CM | POA: Insufficient documentation

## 2022-11-30 DIAGNOSIS — R0602 Shortness of breath: Secondary | ICD-10-CM | POA: Diagnosis not present

## 2022-11-30 DIAGNOSIS — F419 Anxiety disorder, unspecified: Secondary | ICD-10-CM | POA: Insufficient documentation

## 2022-11-30 DIAGNOSIS — E039 Hypothyroidism, unspecified: Secondary | ICD-10-CM | POA: Insufficient documentation

## 2022-11-30 DIAGNOSIS — R079 Chest pain, unspecified: Secondary | ICD-10-CM | POA: Diagnosis not present

## 2022-11-30 DIAGNOSIS — Z91040 Latex allergy status: Secondary | ICD-10-CM | POA: Diagnosis not present

## 2022-11-30 DIAGNOSIS — Z1152 Encounter for screening for COVID-19: Secondary | ICD-10-CM | POA: Diagnosis not present

## 2022-11-30 DIAGNOSIS — G20C Parkinsonism, unspecified: Secondary | ICD-10-CM | POA: Diagnosis not present

## 2022-11-30 LAB — CBC WITH DIFFERENTIAL/PLATELET
Abs Immature Granulocytes: 0.01 10*3/uL (ref 0.00–0.07)
Basophils Absolute: 0.1 10*3/uL (ref 0.0–0.1)
Basophils Relative: 1 %
Eosinophils Absolute: 0 10*3/uL (ref 0.0–0.5)
Eosinophils Relative: 0 %
HCT: 36.2 % (ref 36.0–46.0)
Hemoglobin: 12.3 g/dL (ref 12.0–15.0)
Immature Granulocytes: 0 %
Lymphocytes Relative: 20 %
Lymphs Abs: 1.1 10*3/uL (ref 0.7–4.0)
MCH: 28.3 pg (ref 26.0–34.0)
MCHC: 34 g/dL (ref 30.0–36.0)
MCV: 83.2 fL (ref 80.0–100.0)
Monocytes Absolute: 0.5 10*3/uL (ref 0.1–1.0)
Monocytes Relative: 9 %
Neutro Abs: 4.1 10*3/uL (ref 1.7–7.7)
Neutrophils Relative %: 70 %
Platelets: 300 10*3/uL (ref 150–400)
RBC: 4.35 MIL/uL (ref 3.87–5.11)
RDW: 14.7 % (ref 11.5–15.5)
WBC: 5.9 10*3/uL (ref 4.0–10.5)
nRBC: 0 % (ref 0.0–0.2)

## 2022-11-30 LAB — I-STAT CHEM 8, ED
BUN: 13 mg/dL (ref 8–23)
Calcium, Ion: 1.07 mmol/L — ABNORMAL LOW (ref 1.15–1.40)
Chloride: 98 mmol/L (ref 98–111)
Creatinine, Ser: 0.8 mg/dL (ref 0.44–1.00)
Glucose, Bld: 81 mg/dL (ref 70–99)
HCT: 33 % — ABNORMAL LOW (ref 36.0–46.0)
Hemoglobin: 11.2 g/dL — ABNORMAL LOW (ref 12.0–15.0)
Potassium: 3.6 mmol/L (ref 3.5–5.1)
Sodium: 132 mmol/L — ABNORMAL LOW (ref 135–145)
TCO2: 24 mmol/L (ref 22–32)

## 2022-11-30 LAB — COMPREHENSIVE METABOLIC PANEL
ALT: 16 U/L (ref 0–44)
AST: 22 U/L (ref 15–41)
Albumin: 4.5 g/dL (ref 3.5–5.0)
Alkaline Phosphatase: 58 U/L (ref 38–126)
Anion gap: 13 (ref 5–15)
BUN: 14 mg/dL (ref 8–23)
CO2: 23 mmol/L (ref 22–32)
Calcium: 9.7 mg/dL (ref 8.9–10.3)
Chloride: 97 mmol/L — ABNORMAL LOW (ref 98–111)
Creatinine, Ser: 0.83 mg/dL (ref 0.44–1.00)
GFR, Estimated: >60 mL/min (ref >60)
Glucose, Bld: 89 mg/dL (ref 70–99)
Potassium: 3.9 mmol/L (ref 3.5–5.1)
Sodium: 133 mmol/L — ABNORMAL LOW (ref 135–145)
Total Bilirubin: 1.2 mg/dL (ref 0.3–1.2)
Total Protein: 7.2 g/dL (ref 6.5–8.1)

## 2022-11-30 LAB — TROPONIN I (HIGH SENSITIVITY)
Troponin I (High Sensitivity): 4 ng/L (ref <18)
Troponin I (High Sensitivity): 4 ng/L (ref <18)

## 2022-11-30 LAB — D-DIMER, QUANTITATIVE: D-Dimer, Quant: 0.7 ug/mL-FEU — ABNORMAL HIGH (ref 0.00–0.50)

## 2022-11-30 LAB — SARS CORONAVIRUS 2 BY RT PCR: SARS Coronavirus 2 by RT PCR: NEGATIVE

## 2022-11-30 MED ORDER — LORAZEPAM 1 MG PO TABS
0.5000 mg | ORAL_TABLET | Freq: Once | ORAL | Status: AC
Start: 2022-11-30 — End: 2022-11-30
  Administered 2022-11-30: 0.5 mg via ORAL
  Filled 2022-11-30: qty 1

## 2022-11-30 MED ORDER — IOHEXOL 350 MG/ML SOLN
80.0000 mL | Freq: Once | INTRAVENOUS | Status: AC | PRN
  Administered 2022-11-30: 80 mL via INTRAVENOUS

## 2022-11-30 MED ORDER — IOHEXOL 300 MG/ML  SOLN: 75.0000 mL | Freq: Once | INTRAMUSCULAR | Status: DC | PRN

## 2022-11-30 NOTE — ED Provider Notes (Signed)
[place] [facility] Provider Note   CSN: [id number] Arrival date & time: 11/30/22  1357     History  Chief Complaint  Patient presents with   Anxiety    Emily Berger is a 77 y.o. female history of Alzheimer's disease, hypothyroidism presented today for evaluation of chest pain, shortness of breath and anxiety.  Patient states that she started to have chest pain and shortness of breath this afternoon.  She describes the pain as sharp, in the center of her chest, constant, nonradiating.  Per family at bedside patient appears very anxious today.  Denies any fever, cough, runny nose, nausea, vomiting, bowel change, urinary symptoms.   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 a child - no surgery to correct this - [facility]   Heart murmur    slight   History of hiatal hernia    Hypothyroidism    PONV (postoperative nausea and vomiting)    Past Surgical History:  Procedure Laterality Date   ABDOMINAL HYSTERECTOMY     APPENDECTOMY     CARDIAC CATHETERIZATION     8x as a child   CHOLECYSTECTOMY     NASAL SEPTUM SURGERY     ORIF ANKLE FRACTURE Left 07/22/2018   Procedure: Open reduction internal fixation left ankle trimalleolar fracture/dislocation;  Surgeon: Toni Arthurs, MD;  Location: [facility] OR;  Service: Orthopedics;  Laterality: Left;  120 mins   TONSILLECTOMY     TOOTH EXTRACTION       Home Medications Prior to Admission medications   Medication Sig Start Date End Date Taking? Authorizing Provider  aspirin EC 81 MG tablet Take 1 tablet (81 mg total) by mouth 2 (two) times daily. 07/23/18   Jacinta Shoe, PA-C  Calcium-Vitamin D-Vitamin K (CALCIUM SOFT CHEWS PO) Take 1 tablet by mouth 2 (two) times daily.    [provider]  cholecalciferol (VITAMIN D3) 25 MCG (1000 UT) tablet Take 1,000 Units by mouth  daily.    [provider]  cholecalciferol (VITAMIN D3) 25 MCG (1000 UT) tablet Take 1,000 Units by mouth daily.    [provider]  docusate sodium (COLACE) 100 MG capsule Take 1 capsule (100 mg total) by mouth 2 (two) times daily. While taking narcotic pain medicine. 07/23/18   Jacinta Shoe, PA-C  GARLIC PO Take 1 tablet by mouth daily.    [provider]  methocarbamol (ROBAXIN) 500 MG tablet Take 1 tablet (500 mg total) by mouth 2 (two) times daily. Patient not taking: Reported on 07/10/2018 08/27/13   Fayrene Helper, PA-C  morphine (MSIR) 15 MG tablet Take 1 tablet (15 mg total) by mouth every 4 (four) hours as needed for severe pain. 07/10/18   Melene Plan, DO  Multiple Vitamins-Minerals (MULTIVITAMIN ADULT) TABS Take 1 tablet by mouth daily.    [provider]  naproxen sodium (ALEVE) 220 MG tablet Take 220 mg by mouth 3 (three) times daily.    [provider]  Nutritional Supplements (SOY PROTEIN SHAKE PO) Take 30 mLs by mouth daily.    [provider]  senna (SENOKOT) 8.6 MG TABS tablet Take 2 tablets (17.2 mg total) by mouth 2 (two) times daily. 07/23/18   Jacinta Shoe, PA-C  thyroid [facility] THYROID) 30 MG tablet Take 30 mg by mouth daily before breakfast.  [provider]  thyroid (ARMOUR) 30 MG tablet Take 30 mg by mouth daily before breakfast.    [provider]  traMADol (ULTRAM) 50 MG tablet Take 1 tablet (50 mg total) by mouth every 6 (six) hours as needed for severe pain. Patient not taking: Reported on 07/10/2018 08/27/13   Fayrene Helper, PA-C      Allergies    Latex, Ambien [zolpidem tartrate], Latex, and Percocet [oxycodone-acetaminophen]    Review of Systems   Review of Systems Negative except as per HPI.  Physical Exam Updated Vital Signs BP 133/73   Pulse 73   Temp 98.6 F (37 C) (Oral)   Resp 20   SpO2 100%  Physical Exam Vitals and nursing note reviewed.  Constitutional:      Appearance:  Normal appearance.  HENT:     Head: Normocephalic and atraumatic.     Mouth/Throat:     Mouth: Mucous membranes are moist.  Eyes:     General: No scleral icterus. Cardiovascular:     Rate and Rhythm: Normal rate and regular rhythm.     Pulses: Normal pulses.     Heart sounds: Normal heart sounds.  Pulmonary:     Effort: Pulmonary effort is normal.     Breath sounds: Normal breath sounds.  Abdominal:     General: Abdomen is flat.     Palpations: Abdomen is soft.     Tenderness: There is no abdominal tenderness.  Musculoskeletal:        General: No deformity.  Skin:    General: Skin is warm.     Findings: No rash.  Neurological:     General: No focal deficit present.     Mental Status: She is alert.  Psychiatric:        Mood and Affect: Mood normal.     ED Results / Procedures / Treatments   Labs (all labs ordered are listed, but only abnormal results are displayed) Labs Reviewed  COMPREHENSIVE METABOLIC PANEL - Abnormal; Notable for the following components:      Result Value   Sodium 133 (*)    Chloride 97 (*)    All other components within normal limits  D-DIMER, QUANTITATIVE - Abnormal; Notable for the following components:   D-Dimer, Quant 0.70 (*)    All other components within normal limits  I-STAT CHEM 8, ED - Abnormal; Notable for the following components:   Sodium 132 (*)    Calcium, Ion 1.07 (*)    Hemoglobin 11.2 (*)    HCT 33.0 (*)    All other components within normal limits  SARS CORONAVIRUS 2 BY RT PCR  CBC WITH DIFFERENTIAL/PLATELET  TROPONIN I (HIGH SENSITIVITY)  TROPONIN I (HIGH SENSITIVITY)    EKG EKG Interpretation Date/Time:  Sunday November 30 2022 14:11:15 EDT Ventricular Rate:  94 PR Interval:  138 QRS Duration:  78 QT Interval:  374 QTC Calculation: 467 R Axis:   64  Text Interpretation: Normal sinus rhythm Biatrial enlargement Nonspecific ST abnormality Abnormal ECG No previous ECGs available Confirmed by Meridee Score [id number]) on  11/30/2022 2:45:13 PM  Radiology CT Angio Chest PE W and/or Wo Contrast  Result Date: 11/30/2022 CLINICAL DATA:  elevated d-dimer, shortness of breath EXAM: CT ANGIOGRAPHY CHEST WITH CONTRAST TECHNIQUE: Multidetector CT imaging of the chest was performed using the standard protocol during bolus administration of intravenous contrast. Multiplanar CT image reconstructions and MIPs were obtained to evaluate the vascular anatomy. RADIATION DOSE REDUCTION: This exam was performed according to  the departmental dose-optimization program which includes automated exposure control, adjustment of the mA and/or kV according to patient size and/or use of iterative reconstruction technique. CONTRAST:  80mL OMNIPAQUE IOHEXOL 350 MG/ML SOLN COMPARISON:  Same day chest x-ray FINDINGS: Cardiovascular: Satisfactory opacification of the pulmonary arteries to the segmental level. No evidence of pulmonary embolism. Thoracic aorta is nonaneurysmal. Scattered atherosclerotic calcification of the aorta and coronary arteries. Normal heart size. No pericardial effusion. Mediastinum/Nodes: No enlarged mediastinal, hilar, or axillary lymph nodes. Thyroid gland, trachea, and esophagus demonstrate no significant findings. Lungs/Pleura: No airspace consolidation. No pleural effusion or pneumothorax. Upper Abdomen: Left hydronephrosis versus an extrarenal pelvis, incompletely imaged. Musculoskeletal: No chest wall abnormality. No acute or significant osseous findings. Review of the MIP images confirms the above findings. IMPRESSION: 1. No evidence of pulmonary embolism or other acute intrathoracic findings. 2. Aortic and coronary artery atherosclerosis (ICD10-I70.0). 3. Left hydronephrosis versus an extrarenal pelvis, incompletely imaged. Consider further evaluation with dedicated renal ultrasound. Electronically Signed   By: Duanne Guess D.O.   On: 11/30/2022 17:08   DG Chest 2 View  Result Date: 11/30/2022 CLINICAL DATA:  Chest pain  EXAM: CHEST - 2 VIEW COMPARISON:  None Available. FINDINGS: The heart size and mediastinal contours are within normal limits. Hyperinflated lungs. No focal airspace consolidation, pleural effusion, or pneumothorax. The visualized skeletal structures are unremarkable. IMPRESSION: Hyperinflated lungs suggesting COPD. No acute cardiopulmonary findings. Electronically Signed   By: Duanne Guess D.O.   On: 11/30/2022 14:30    Procedures Procedures    Medications Ordered in ED Medications  iohexol (OMNIPAQUE) 300 MG/ML solution 75 mL ( Intravenous Canceled Entry 11/30/22 1637)  LORazepam (ATIVAN) tablet 0.5 mg (0.5 mg Oral Given 11/30/22 1514)  iohexol (OMNIPAQUE) 350 MG/ML injection 80 mL (80 mLs Intravenous Contrast Given 11/30/22 1636)    ED Course/ Medical Decision Making/ A&P                             Medical Decision Making Amount and/or Complexity of Data Reviewed Labs: ordered. Radiology: ordered.  Risk Prescription drug management.   This patient presents to the ED for chest pain, shortness of breath, anxiety, this involves an extensive number of treatment options, and is a complaint that carries with a high risk of complications and morbidity.  The differential diagnosis includes ACS, pericarditis, PE, pneumothorax, pneumonia, less likely dissection with essentially normal blood pressure, symmetric bilateral pulses, and no back pain.  This is not an exhaustive list.  Lab tests: I ordered and personally interpreted labs.  The pertinent results include: WBC unremarkable. Hbg unremarkable. Platelets unremarkable. Electrolytes unremarkable. BUN, creatinine unremarkable. D-dimer 0.7.  COVID test was negative.  Troponin was normal.  Imaging studies: I ordered imaging studies, personally reviewed, interpreted imaging and agreed with the radiologist's interpretations. The results include: Chest x-ray showed hyperinflated lungs COPD.  CT angio chest showed no evidence of PE.  Problem  list/ ED course/ Critical interventions/ Medical management: HPI: See above Vital signs within normal range and stable throughout visit. Laboratory/imaging studies significant for: See above. On physical examination, patient is afebrile and appears in no acute distress.  EKG without signs of active ischemia, given the timing of pain to ER presentation, delta troponin was negative so doubt NSTEMI.  Exam without evidence of volume overload so doubt heart failure.  Chest x-ray showed hyperinflated lungs, suggesting COPD however patient has no history of COPD, she is not a smoker, lung sounds  are clear, no wheezes.  Based on chest x-ray and CT, I have low suspicion for pneumonia, pneumothorax and PE.  Unlikely myocarditis, patient has no recent illness, troponin was negative.  Given Ativan for anxiety.  Reevaluation of patient after this medication showed that the patient improved.  Patient is hemodynamically stable and appears comfortable in bed.  Symptoms are not likely related to anxiety.  Heart score is 3 so plan to discharge patient home with PCP follow-up. I have reviewed the patient home medicines and have made adjustments as needed.  Cardiac monitoring/EKG: The patient was maintained on a cardiac monitor.  I personally reviewed and interpreted the cardiac monitor which showed an underlying rhythm of: sinus rhythm.  Additional history obtained: External records from outside source obtained and reviewed including: Chart review including previous notes, labs, imaging.  Consultations obtained:  Disposition Continued outpatient therapy. Follow-up with PCP recommended for reevaluation of symptoms. Treatment plan discussed with patient.  Pt acknowledged understanding was agreeable to the plan. Worrisome signs and symptoms were discussed with patient, and patient acknowledged understanding to return to the ED if they noticed these signs and symptoms. Patient was stable upon discharge.   This chart was  dictated using voice recognition software.  Despite best efforts to proofread,  errors can occur which can change the documentation meaning.          Final Clinical Impression(s) / ED Diagnoses Final diagnoses:  Anxiety  SOB (shortness of breath)  Chest pain, unspecified type    Rx / DC Orders ED Discharge Orders     None         Jeanelle Malling, [place] 11/30/22 Mallie Snooks    Ernie Avena, MD 11/30/22 1921

## 2022-11-30 NOTE — ED Notes (Signed)
ED Provider at bedside. 

## 2022-11-30 NOTE — ED Triage Notes (Signed)
Pt here with daughter-in-law; pt is anxious and crying saying she is SHOB; no resp distress noted; hx Alzheimer's; pt c/o some chest tightness

## 2022-11-30 NOTE — Discharge Instructions (Addendum)
EKG, laboratory testing was overall reassuring.  Your CT imaging was negative for evidence of a blood clot or other acute abnormality in your lungs.  Your COVID testing will be available on the patient portal.  Follow-up outpatient with your PCP.

## 2023-05-15 ENCOUNTER — Emergency Department (HOSPITAL_COMMUNITY): Payer: Medicare Other

## 2023-05-15 ENCOUNTER — Emergency Department (HOSPITAL_COMMUNITY)
Admission: EM | Admit: 2023-05-15 | Discharge: 2023-05-15 | Disposition: A | Discharge status: Home / Self Care | Payer: Medicare Other | Attending: Emergency Medicine | Admitting: Emergency Medicine

## 2023-05-15 ENCOUNTER — Encounter (HOSPITAL_COMMUNITY): Payer: Self-pay

## 2023-05-15 ENCOUNTER — Other Ambulatory Visit: Payer: Self-pay

## 2023-05-15 DIAGNOSIS — R569 Unspecified convulsions: Secondary | ICD-10-CM | POA: Insufficient documentation

## 2023-05-15 DIAGNOSIS — Z79899 Other long term (current) drug therapy: Secondary | ICD-10-CM | POA: Insufficient documentation

## 2023-05-15 DIAGNOSIS — Z91040 Latex allergy status: Secondary | ICD-10-CM | POA: Insufficient documentation

## 2023-05-15 DIAGNOSIS — Z7982 Long term (current) use of aspirin: Secondary | ICD-10-CM | POA: Diagnosis not present

## 2023-05-15 LAB — CBC WITH DIFFERENTIAL/PLATELET
Abs Immature Granulocytes: 0.05 10*3/uL (ref 0.00–0.07)
Basophils Absolute: 0.1 10*3/uL (ref 0.0–0.1)
Basophils Relative: 1 %
Eosinophils Absolute: 0 10*3/uL (ref 0.0–0.5)
Eosinophils Relative: 1 %
HCT: 35.7 % — ABNORMAL LOW (ref 36.0–46.0)
Hemoglobin: 12 g/dL (ref 12.0–15.0)
Immature Granulocytes: 1 %
Lymphocytes Relative: 6 %
Lymphs Abs: 0.4 10*3/uL — ABNORMAL LOW (ref 0.7–4.0)
MCH: 28.4 pg (ref 26.0–34.0)
MCHC: 33.6 g/dL (ref 30.0–36.0)
MCV: 84.4 fL (ref 80.0–100.0)
Monocytes Absolute: 0.6 10*3/uL (ref 0.1–1.0)
Monocytes Relative: 8 %
Neutro Abs: 6.5 10*3/uL (ref 1.7–7.7)
Neutrophils Relative %: 83 %
Platelets: 229 10*3/uL (ref 150–400)
RBC: 4.23 MIL/uL (ref 3.87–5.11)
RDW: 14.8 % (ref 11.5–15.5)
WBC: 7.6 10*3/uL (ref 4.0–10.5)
nRBC: 0 % (ref 0.0–0.2)

## 2023-05-15 LAB — I-STAT CHEM 8, ED
BUN: 18 mg/dL (ref 8–23)
Calcium, Ion: 1.2 mmol/L (ref 1.15–1.40)
Chloride: 103 mmol/L (ref 98–111)
Creatinine, Ser: 0.7 mg/dL (ref 0.44–1.00)
Glucose, Bld: 133 mg/dL — ABNORMAL HIGH (ref 70–99)
HCT: 35 % — ABNORMAL LOW (ref 36.0–46.0)
Hemoglobin: 11.9 g/dL — ABNORMAL LOW (ref 12.0–15.0)
Potassium: 3.7 mmol/L (ref 3.5–5.1)
Sodium: 138 mmol/L (ref 135–145)
TCO2: 24 mmol/L (ref 22–32)

## 2023-05-15 LAB — COMPREHENSIVE METABOLIC PANEL
ALT: 18 U/L (ref 0–44)
AST: 23 U/L (ref 15–41)
Albumin: 3.5 g/dL (ref 3.5–5.0)
Alkaline Phosphatase: 106 U/L (ref 38–126)
Anion gap: 10 (ref 5–15)
BUN: 16 mg/dL (ref 8–23)
CO2: 24 mmol/L (ref 22–32)
Calcium: 9.2 mg/dL (ref 8.9–10.3)
Chloride: 102 mmol/L (ref 98–111)
Creatinine, Ser: 0.93 mg/dL (ref 0.44–1.00)
GFR, Estimated: >60 mL/min (ref >60)
Glucose, Bld: 141 mg/dL — ABNORMAL HIGH (ref 70–99)
Potassium: 3.7 mmol/L (ref 3.5–5.1)
Sodium: 136 mmol/L (ref 135–145)
Total Bilirubin: 0.6 mg/dL (ref 0.0–1.2)
Total Protein: 6.2 g/dL — ABNORMAL LOW (ref 6.5–8.1)

## 2023-05-15 LAB — MAGNESIUM: Magnesium: 2.4 mg/dL (ref 1.7–2.4)

## 2023-05-15 MED ORDER — DIVALPROEX SODIUM 500 MG PO DR TAB: 500.0000 mg | DELAYED_RELEASE_TABLET | Freq: Two times a day (BID) | ORAL | 1 refills | Status: DC

## 2023-05-15 MED ORDER — LORAZEPAM 2 MG/ML IJ SOLN
0.5000 mg | Freq: Once | INTRAMUSCULAR | Status: AC | PRN
  Administered 2023-05-15: 0.5 mg via INTRAVENOUS
  Filled 2023-05-15: qty 1

## 2023-05-15 MED ORDER — VALPROATE SODIUM 100 MG/ML IV SOLN
1000.0000 mg | Freq: Once | INTRAVENOUS | Status: AC
  Administered 2023-05-15: 1000 mg via INTRAVENOUS
  Filled 2023-05-15: qty 10

## 2023-05-15 NOTE — ED Provider Notes (Addendum)
 Patient signed to me by Dr. Wandra and patient's MRI brain without acute abnormalities.  Patient was given dose of Depakote  per recommendation of neurology for new onset seizures.  Family at bedside discussed findings and will be discharged with them  8:15 PM Patient reassessed and more arousable at this time.  Suspect this is side effect from the Depakote .  States she is hungry.  Will give liquids here in sent home by ROME [name] Faden, MD 05/15/23 1725    [name] Faden, MD 05/15/23 2015

## 2023-05-15 NOTE — Discharge Instructions (Addendum)
-   According to [place] law, you can not drive unless you are seizure / syncope free for at least 6 months and under physician's care.  °  °- Please maintain precautions. Do not participate in activities where a loss of awareness could harm you or someone else. No swimming alone, no tub bathing, no hot tubs, no driving, no operating motorized vehicles (cars, ATVs, motocycles, etc), lawnmowers, power tools or firearms. No standing at heights, such as rooftops, ladders or stairs. Avoid hot objects such as stoves, heaters, open fires. Wear a helmet when riding a bicycle, scooter, skateboard, etc. and avoid areas of traffic. Set your water heater to 120 degrees or less.  °

## 2023-05-15 NOTE — ED Provider Notes (Signed)
 [place] [facility] Provider Note   CSN: [id number] Arrival date & time: 05/15/23  1240     History  Chief Complaint  Patient presents with   Seizures    Emily Berger is a 78 y.o. female.  HPI 78 year old female with a history of dementia presents with a possible seizure.  History is primarily from the daughter-in-law.  She was not at home with the patient but the patient's son (her husband) was there.  The patient fell to the ground and was shaking.  EMS reported that when they first arrived the patient seemed postictal.  Otherwise the patient has dementia and is frequently agitated.  Things seem to be worse over the last 2 weeks with her having increased agitation and actions such as flushing things down the toilet.  She has not had any fevers or recent change in medications.  When I am assessing the patient with the daughter-in-law at the bedside, she seems to be at her baseline according to the daughter-in-law.  No known history of seizures.  No history of trauma.  Home Medications Prior to Admission medications   Medication Sig Start Date End Date Taking? Authorizing Provider  divalproex  (DEPAKOTE ) 500 MG DR tablet Take 1 tablet (500 mg total) by mouth 2 (two) times daily. 05/15/23  Yes Freddi Hamilton, MD  aspirin  EC 81 MG tablet Take 1 tablet (81 mg total) by mouth 2 (two) times daily. 07/23/18   Aniceto Eva Grebe, PA-C  Calcium-Vitamin D -Vitamin K (CALCIUM SOFT CHEWS PO) Take 1 tablet by mouth 2 (two) times daily.    [provider]  cholecalciferol  (VITAMIN D3) 25 MCG (1000 UT) tablet Take 1,000 Units by mouth daily.    [provider]  cholecalciferol  (VITAMIN D3) 25 MCG (1000 UT) tablet Take 1,000 Units by mouth daily.    [provider]  docusate sodium  (COLACE) 100 MG capsule Take 1 capsule (100 mg total) by mouth 2 (two) times daily. While taking narcotic pain medicine. 07/23/18   Aniceto Eva Grebe, PA-C   GARLIC PO Take 1 tablet by mouth daily.    [provider]  methocarbamol  (ROBAXIN ) 500 MG tablet Take 1 tablet (500 mg total) by mouth 2 (two) times daily. Patient not taking: Reported on 07/10/2018 08/27/13   Nivia Colon, PA-C  morphine  (MSIR) 15 MG tablet Take 1 tablet (15 mg total) by mouth every 4 (four) hours as needed for severe pain. 07/10/18   Emil Share, DO  Multiple Vitamins-Minerals (MULTIVITAMIN ADULT) TABS Take 1 tablet by mouth daily.    [provider]  naproxen sodium (ALEVE) 220 MG tablet Take 220 mg by mouth 3 (three) times daily.    [provider]  Nutritional Supplements (SOY PROTEIN SHAKE PO) Take 30 mLs by mouth daily.    [provider]  senna (SENOKOT) 8.6 MG TABS tablet Take 2 tablets (17.2 mg total) by mouth 2 (two) times daily. 07/23/18   Aniceto Eva Grebe, PA-C  thyroid  (ARMOUR THYROID ) 30 MG tablet Take 30 mg by mouth daily before breakfast.    [provider]  thyroid  (ARMOUR) 30 MG tablet Take 30 mg by mouth daily before breakfast.    [provider]  traMADol  (ULTRAM ) 50 MG tablet Take 1 tablet (50 mg total) by mouth every 6 (six) hours as needed for severe pain. Patient not taking: Reported on 07/10/2018 08/27/13   Nivia Colon, PA-C      Allergies    Latex, Ambien [  zolpidem tartrate], Latex, and Percocet [oxycodone -acetaminophen ]    Review of Systems   Review of Systems  Unable to perform ROS: Dementia    Physical Exam Updated Vital Signs BP (!) 109/49   Pulse 97   Temp 97.7 F (36.5 C) (Oral)   Resp 18   Ht 5' 6 (1.676 m)   Wt 52.2 kg   SpO2 97%   BMI 18.56 kg/m  Physical Exam Vitals and nursing note reviewed.  Constitutional:      General: She is not in acute distress.    Appearance: She is well-developed. She is not ill-appearing or diaphoretic.  HENT:     Head: Normocephalic and atraumatic.  Eyes:     Pupils: Pupils are equal, round, and reactive to light.  Cardiovascular:     Rate and  Rhythm: Normal rate and regular rhythm.     Heart sounds: Normal heart sounds.  Pulmonary:     Effort: Pulmonary effort is normal.     Breath sounds: Normal breath sounds.  Abdominal:     General: There is no distension.     Palpations: Abdomen is soft.     Tenderness: There is no abdominal tenderness.  Musculoskeletal:     Cervical back: Normal range of motion. No rigidity.  Skin:    General: Skin is warm and dry.  Neurological:     Mental Status: She is alert.     Comments: Patient moves all 4 extremities.  She frequently disagrees with things that are said or seems a little agitated.  However for the most part she follows commands.  She and the daughter-in-law get in different arguments.     ED Results / Procedures / Treatments   Labs (all labs ordered are listed, but only abnormal results are displayed) Labs Reviewed  COMPREHENSIVE METABOLIC PANEL - Abnormal; Notable for the following components:      Result Value   Glucose, Bld 141 (*)    Total Protein 6.2 (*)    All other components within normal limits  CBC WITH DIFFERENTIAL/PLATELET - Abnormal; Notable for the following components:   HCT 35.7 (*)    Lymphs Abs 0.4 (*)    All other components within normal limits  I-STAT CHEM 8, ED - Abnormal; Notable for the following components:   Glucose, Bld 133 (*)    Hemoglobin 11.9 (*)    HCT 35.0 (*)    All other components within normal limits  MAGNESIUM   URINALYSIS, W/ REFLEX TO CULTURE (INFECTION SUSPECTED)    EKG EKG Interpretation Date/Time:  Friday May 15 2023 13:35:57 EST Ventricular Rate:  79 PR Interval:  146 QRS Duration:  89 QT Interval:  418 QTC Calculation: 480 R Axis:   50  Text Interpretation: Sinus rhythm Probable left atrial enlargement RSR' in V1 or V2, probably normal variant diffuse nonspecific ST changes no significant change since July 2024 Confirmed by Freddi Hamilton [phone]) on 05/15/2023 2:26:32 PM  Radiology CT HEAD WO CONTRAST Result  Date: 05/15/2023 CLINICAL DATA:  Seizure like activity EXAM: CT HEAD WITHOUT CONTRAST TECHNIQUE: Contiguous axial images were obtained from the base of the skull through the vertex without intravenous contrast. RADIATION DOSE REDUCTION: This exam was performed according to the departmental dose-optimization program which includes automated exposure control, adjustment of the mA and/or kV according to patient size and/or use of iterative reconstruction technique. COMPARISON:  None Available. FINDINGS: Brain: No evidence of acute infarction, hemorrhage, mass, mass effect, or midline shift. No hydrocephalus or extra-axial fluid collection.  Vascular: No hyperdense vessel. Skull: Negative for fracture or focal lesion. Sinuses/Orbits: Mucosal thickening in the anterior ethmoid air cells. No acute finding in the orbits. Other: The mastoid air cells are well aerated. IMPRESSION: No acute intracranial process. Electronically Signed   By: Donald Campion M.D.   On: 05/15/2023 14:30    Procedures Procedures    Medications Ordered in ED Medications  valproate (DEPACON ) 1,000 mg in dextrose  5 % 50 mL IVPB (has no administration in time range)  LORazepam  (ATIVAN ) injection 0.5 mg (has no administration in time range)    ED Course/ Medical Decision Making/ A&P                                 Medical Decision Making Amount and/or Complexity of Data Reviewed Labs: ordered.    Details: Unremarkable electrolytes Radiology: ordered and independent interpretation performed.    Details: CT head negative ECG/medicine tests: ordered and independent interpretation performed.    Details: No ischemia  Risk Prescription drug management.   Presentation sounds like a seizure.  Son endorses that she was shaking diffusely and then unresponsive and then a postictal phase.  No prior seizures.  Workup so far here is unremarkable.  She is at her baseline currently.  Discussed with Dr. Voncile, who recommends 1g depakoate load  IV, then d/c with 500 mg BID.  However, should get MRI to rule out occult stroke as the cause.  If this is negative can discharge to follow-up with outpatient neuro.  Care transferred to Dr. [name].        Final Clinical Impression(s) / ED Diagnoses Final diagnoses:  Seizure (HCC)    Rx / DC Orders ED Discharge Orders          Ordered    divalproex  (DEPAKOTE ) 500 MG DR tablet  2 times daily        05/15/23 1524    Ambulatory referral to Neurology       Comments: An appointment is requested in approximately: 2 weeks   05/15/23 1524              Freddi Hamilton, MD 05/15/23 1551

## 2023-05-15 NOTE — ED Notes (Addendum)
PT returned from MRI.

## 2023-05-15 NOTE — ED Notes (Signed)
 Confirmed with family that patient can be sent home via [facility] and a family member would be at home to let/help them in. Address family provided is [address] [place], [place].

## 2023-05-15 NOTE — ED Triage Notes (Signed)
 PT BIB [facility] from home where family witnessed seizure like activity, seizing while at the breakfast table. EMS described PT as post ictal on arrival, with a CBG of 180. EMS and family deny a fall. PT Oriented to self only. Alzheimers Hx, with family describing increased agitation and impulsivity, with some visual disturbances x2 weeks. [facility] states VSS with mild tachycardia, HR of 114.

## 2023-05-15 NOTE — Plan of Care (Signed)
 On-call neurology note  Patient with history of dementia with behavioral disturbance with a first-time seizure with some postictal state.  Now back to baseline.  Has had some previous episodes concerning for possible seizure-like activity which has been more staring spells.  The patient with advanced dementia, seizures are more common.  Given her behavioral disturbance, Keppra would not be an ideal medication  Recommend loading with Depakote  1 g IV x 1 and starting Depakote  500 twice daily at home.  Follow-up with neurology in 2 to 4 weeks.  Follow-up with PCP in a week and check Depakote  level.  -- Eligio Lav, MD Neurologist [facility]

## 2023-05-15 NOTE — ED Notes (Signed)
 Patient transported to MRI

## 2023-05-15 NOTE — ED Notes (Addendum)
 Patient drank multiple gulps of water without coughing. No difficulties swallowing noted.

## 2023-10-24 ENCOUNTER — Emergency Department (HOSPITAL_COMMUNITY)

## 2023-10-24 ENCOUNTER — Other Ambulatory Visit: Payer: Self-pay

## 2023-10-24 ENCOUNTER — Encounter (HOSPITAL_COMMUNITY): Payer: Self-pay

## 2023-10-24 ENCOUNTER — Emergency Department (HOSPITAL_COMMUNITY)
Admission: EM | Admit: 2023-10-24 | Discharge: 2023-10-30 | Disposition: A | Discharge status: Home / Self Care | Attending: Emergency Medicine | Admitting: Emergency Medicine

## 2023-10-24 DIAGNOSIS — F03918 Unspecified dementia, unspecified severity, with other behavioral disturbance: Secondary | ICD-10-CM

## 2023-10-24 DIAGNOSIS — Z91040 Latex allergy status: Secondary | ICD-10-CM | POA: Insufficient documentation

## 2023-10-24 DIAGNOSIS — D72829 Elevated white blood cell count, unspecified: Secondary | ICD-10-CM | POA: Insufficient documentation

## 2023-10-24 DIAGNOSIS — Z7982 Long term (current) use of aspirin: Secondary | ICD-10-CM | POA: Insufficient documentation

## 2023-10-24 DIAGNOSIS — R4182 Altered mental status, unspecified: Secondary | ICD-10-CM | POA: Diagnosis present

## 2023-10-24 DIAGNOSIS — N390 Urinary tract infection, site not specified: Secondary | ICD-10-CM | POA: Insufficient documentation

## 2023-10-24 DIAGNOSIS — F411 Generalized anxiety disorder: Secondary | ICD-10-CM | POA: Insufficient documentation

## 2023-10-24 DIAGNOSIS — F329 Major depressive disorder, single episode, unspecified: Secondary | ICD-10-CM | POA: Insufficient documentation

## 2023-10-24 LAB — CBC
HCT: 41 % (ref 36.0–46.0)
Hemoglobin: 13.7 g/dL (ref 12.0–15.0)
MCH: 28.2 pg (ref 26.0–34.0)
MCHC: 33.4 g/dL (ref 30.0–36.0)
MCV: 84.5 fL (ref 80.0–100.0)
Platelets: 286 10*3/uL (ref 150–400)
RBC: 4.85 MIL/uL (ref 3.87–5.11)
RDW: 15.3 % (ref 11.5–15.5)
WBC: 10.6 10*3/uL — ABNORMAL HIGH (ref 4.0–10.5)
nRBC: 0 % (ref 0.0–0.2)

## 2023-10-24 LAB — URINALYSIS, W/ REFLEX TO CULTURE (INFECTION SUSPECTED)
Bilirubin Urine: NEGATIVE
Glucose, UA: NEGATIVE mg/dL
Hgb urine dipstick: NEGATIVE
Ketones, ur: NEGATIVE mg/dL
Nitrite: POSITIVE — AB
Protein, ur: NEGATIVE mg/dL
Specific Gravity, Urine: 1.023 (ref 1.005–1.030)
pH: 5 (ref 5.0–8.0)

## 2023-10-24 LAB — COMPREHENSIVE METABOLIC PANEL WITH GFR
ALT: 18 U/L (ref 0–44)
AST: 22 U/L (ref 15–41)
Albumin: 4.5 g/dL (ref 3.5–5.0)
Alkaline Phosphatase: 78 U/L (ref 38–126)
Anion gap: 12 (ref 5–15)
BUN: 21 mg/dL (ref 8–23)
CO2: 26 mmol/L (ref 22–32)
Calcium: 9.6 mg/dL (ref 8.9–10.3)
Chloride: 102 mmol/L (ref 98–111)
Creatinine, Ser: 0.79 mg/dL (ref 0.44–1.00)
GFR, Estimated: >60 mL/min (ref >60)
Glucose, Bld: 107 mg/dL — ABNORMAL HIGH (ref 70–99)
Potassium: 3.6 mmol/L (ref 3.5–5.1)
Sodium: 140 mmol/L (ref 135–145)
Total Bilirubin: 0.7 mg/dL (ref 0.0–1.2)
Total Protein: 7.9 g/dL (ref 6.5–8.1)

## 2023-10-24 LAB — RAPID URINE DRUG SCREEN, HOSP PERFORMED
Amphetamines: NOT DETECTED
Barbiturates: NOT DETECTED
Benzodiazepines: NOT DETECTED
Cocaine: NOT DETECTED
Opiates: NOT DETECTED
Tetrahydrocannabinol: NOT DETECTED

## 2023-10-24 LAB — ETHANOL: Alcohol, Ethyl (B): <15 mg/dL (ref <15)

## 2023-10-24 MED ORDER — DONEPEZIL HCL 5 MG PO TABS
10.0000 mg | ORAL_TABLET | Freq: Every day | ORAL | Status: DC
  Administered 2023-10-24 – 2023-10-30 (×7): 10 mg via ORAL
  Filled 2023-10-24 (×7): qty 2

## 2023-10-24 MED ORDER — SODIUM CHLORIDE 0.9 % IV SOLN
1.0000 g | Freq: Once | INTRAVENOUS | Status: DC
  Filled 2023-10-24: qty 10

## 2023-10-24 MED ORDER — VITAMIN D 25 MCG (1000 UNIT) PO TABS
1000.0000 [IU] | ORAL_TABLET | Freq: Every day | ORAL | Status: DC
  Administered 2023-10-25 – 2023-10-30 (×6): 1000 [IU] via ORAL
  Filled 2023-10-24 (×6): qty 1

## 2023-10-24 MED ORDER — CEPHALEXIN 500 MG PO CAPS: 500.0000 mg | ORAL_CAPSULE | Freq: Three times a day (TID) | ORAL | Status: DC

## 2023-10-24 MED ORDER — ASPIRIN 81 MG PO TBEC
81.0000 mg | DELAYED_RELEASE_TABLET | Freq: Two times a day (BID) | ORAL | Status: DC
  Administered 2023-10-24 – 2023-10-30 (×13): 81 mg via ORAL
  Filled 2023-10-24 (×13): qty 1

## 2023-10-24 MED ORDER — QUETIAPINE FUMARATE 50 MG PO TABS
50.0000 mg | ORAL_TABLET | Freq: Every day | ORAL | Status: DC
  Administered 2023-10-24: 50 mg via ORAL
  Filled 2023-10-24: qty 1

## 2023-10-24 MED ORDER — CEPHALEXIN 500 MG PO CAPS
500.0000 mg | ORAL_CAPSULE | Freq: Three times a day (TID) | ORAL | Status: AC
  Administered 2023-10-24 – 2023-10-30 (×19): 500 mg via ORAL
  Filled 2023-10-24 (×18): qty 1

## 2023-10-24 MED ORDER — MEMANTINE HCL 5 MG PO TABS
10.0000 mg | ORAL_TABLET | Freq: Two times a day (BID) | ORAL | Status: DC
  Administered 2023-10-24 – 2023-10-30 (×13): 10 mg via ORAL
  Filled 2023-10-24 (×14): qty 2

## 2023-10-24 MED ORDER — LEVOTHYROXINE SODIUM 50 MCG PO TABS
50.0000 ug | ORAL_TABLET | Freq: Every day | ORAL | Status: DC
  Administered 2023-10-25 – 2023-10-30 (×6): 50 ug via ORAL
  Filled 2023-10-24 (×6): qty 1

## 2023-10-24 MED ORDER — DIVALPROEX SODIUM 500 MG PO DR TAB: 500.0000 mg | DELAYED_RELEASE_TABLET | Freq: Two times a day (BID) | ORAL | Status: DC

## 2023-10-24 NOTE — ED Notes (Signed)
 Pt. Has dried scar tissue near her tailbone area. No redness, no discharge, no foul odor noted.
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
## 2023-10-24 NOTE — ED Notes (Signed)
Pt belongings in locker 28
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
## 2023-10-25 DIAGNOSIS — F411 Generalized anxiety disorder: Secondary | ICD-10-CM | POA: Insufficient documentation

## 2023-10-25 DIAGNOSIS — F03918 Unspecified dementia, unspecified severity, with other behavioral disturbance: Secondary | ICD-10-CM

## 2023-10-25 MED ORDER — DIVALPROEX SODIUM 125 MG PO CSDR
125.0000 mg | DELAYED_RELEASE_CAPSULE | Freq: Three times a day (TID) | ORAL | Status: DC
Start: 2023-10-25 — End: 2023-10-31
  Administered 2023-10-25 – 2023-10-30 (×18): 125 mg via ORAL
  Filled 2023-10-25 (×17): qty 1

## 2023-10-25 MED ORDER — HYDROXYZINE HCL 10 MG PO TABS
10.0000 mg | ORAL_TABLET | Freq: Three times a day (TID) | ORAL | Status: DC | PRN
  Filled 2023-10-25: qty 1

## 2023-10-25 MED ORDER — RISPERIDONE 0.5 MG PO TBDP: 0.2500 mg | ORAL_TABLET | Freq: Every day | ORAL | Status: DC

## 2023-10-25 MED ORDER — RISPERIDONE 0.25 MG PO TABS
0.2500 mg | ORAL_TABLET | Freq: Every day | ORAL | Status: DC
  Administered 2023-10-25 – 2023-10-30 (×6): 0.25 mg via ORAL
  Filled 2023-10-25 (×6): qty 1
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
## 2023-10-25 NOTE — ED Notes (Signed)
 Patient resting comfortably at this time.
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
## 2023-10-25 NOTE — BH Assessment (Signed)
 TTS clinician attempted to complete assessment. Per Amandip, RN, patient asleep unable to participate in assessment. RN will notify TTS when patient is alert and oriented.
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
## 2023-10-26 ENCOUNTER — Encounter (HOSPITAL_COMMUNITY): Payer: Self-pay | Admitting: Psychiatry

## 2023-10-26 DIAGNOSIS — F329 Major depressive disorder, single episode, unspecified: Secondary | ICD-10-CM | POA: Insufficient documentation

## 2023-10-26 NOTE — ED Notes (Signed)
 Patient has been calm and cooperative with no behavioral concerns this morning and none reported last night.

## 2023-10-26 NOTE — Progress Notes (Signed)
 Inpatient Psychiatric Referral  Patient was recommended inpatient per (NP). There are no available beds at [facility], per [facility] . Patient was re-faxed to the following out of network facilities:  Destination  Service Provider Request Status Address Phone Fax  [facility]  Pending - Request Sent [address] [place], [place] [address] [phone]  [facility]  Pending - Request Sent [address] [place] [address] [phone]  [facility]  Pending - Request Sent [address] [place] [address] [phone]  [facility]  Pending - Request Sent [address] [place] [address] [phone]  [facility]  Pending - Request [facility] [address] [place] [address] [phone]  [facility]  Pending - Request Sent [address] [place] [address] [phone]  [facility]  Pending - Request Sent [address] [place] [phone]  [facility]  Pending - Request Sent [address], [place] [address] [phone]  [facility]  Pending - Request Sent [address], [place] [address] [phone]  [facility]  Pending - Request Sent [address] [place], [place] [address] [phone]    Situation ongoing, CSW to continue following and update chart as more information becomes available.   Harrie Sofia MSW, LCSWA 10/26/2023  8:45PM
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
## 2023-10-26 NOTE — Progress Notes (Signed)
 LCSW Progress Note  [id number]   [name] [facility]  10/26/2023  12:10 PM  Description:   Inpatient Psychiatric Referral  Patient was recommended inpatient per [facility] NP. There [facility] beds at [facility], per [facility] RN. Patient was referred to the following out of network facilities:   Destination  Service Provider Address Phone Fax  [facility]  [address] [place], [place] [address] [phone]  [facility]  [address] [place] [address] [phone]  [facility]  [address], [place] [address] [phone]  [facility]  [address], [place] [address] [phone]  [facility]  [address] [place] [address] [phone]  [facility]  [address] [place] [address] [phone]  [facility] EFAX  [address], [place] [phone]      Situation ongoing, CSW to continue following and update chart as more information becomes available.      Emily Berger, MSW, LCSW  10/26/2023 12:10 PM
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
## 2023-10-27 LAB — URINE CULTURE: Culture: >=100000 — AB

## 2023-10-27 MED ORDER — LORAZEPAM 0.5 MG PO TABS
0.5000 mg | ORAL_TABLET | Freq: Once | ORAL | Status: AC
  Administered 2023-10-27: 0.5 mg via ORAL

## 2023-10-27 MED ORDER — LORAZEPAM 0.5 MG PO TABS
0.5000 mg | ORAL_TABLET | Freq: Two times a day (BID) | ORAL | Status: DC
  Administered 2023-10-27 – 2023-10-30 (×7): 0.5 mg via ORAL
  Filled 2023-10-27 (×8): qty 1

## 2023-10-27 MED ORDER — SERTRALINE HCL 50 MG PO TABS
25.0000 mg | ORAL_TABLET | Freq: Every day | ORAL | Status: DC
  Administered 2023-10-27 – 2023-10-30 (×4): 25 mg via ORAL
  Filled 2023-10-27 (×4): qty 1

## 2023-10-27 NOTE — ED Provider Notes (Signed)
 Emergency Medicine Observation Re-evaluation Note  Emily Berger is a 78 y.o. female, seen on rounds today.  Pt initially presented to the ED for complaints of IVC Currently, the patient is in room without complaint.  Physical Exam  BP 135/77 (BP Location: Right Arm)   Pulse 72   Temp (!) 97.5 F (36.4 C) (Oral)   Resp 16   SpO2 99%  Physical Exam General: Awake, alert Cardiac: Normal rate Lungs: Normal effort Psych: Appropriate mood  ED Course / MDM  EKG:EKG Interpretation Date/Time:  Saturday October 24 2023 22:51:13 EDT Ventricular Rate:  63 PR Interval:  142 QRS Duration:  78 QT Interval:  442 QTC Calculation: 452 R Axis:   47  Text Interpretation: Normal sinus rhythm Normal ECG When compared with ECG of 15-May-2023 13:35, No significant change since last tracing Confirmed by Towana Sharper [phone]) on 10/24/2023 10:52:24 PM  I have reviewed the labs performed to date as well as medications administered while in observation.  Recent changes in the last 24 hours include no changes, awaiting placement.  Plan  Current plan is for placement.  Confirm patient has been receiving oral antibiotics for nitrite positive UTI.  Susceptibilities not yet released, patient receiving Keflex ..    Mannie Pac T, DO 10/27/23 [phone]

## 2023-10-27 NOTE — Progress Notes (Signed)
 LCSW Progress Note  [id number]   [name]  10/27/2023  12:15 PM  Description:   Inpatient Psychiatric Referral  Patient was recommended inpatient per Starlyn Patron NP. There are no available beds at [facility], per [facility] Cherylynn Ernst RN. Patient was referred to the following out of network facilities:   Destination  Service Provider Address Phone Fax  [facility]  [address] [place], [place] [address] [phone]  [facility]  [address] [place] [address] [phone]  [facility]  [address] [place] [address] [phone]  [facility]  [address], [place] [address] [phone]  [facility]  [address] [place] [address] [phone]  [facility]  [address] [place] [address] [phone]  [facility] EFAX  [address], [place] [phone]  [facility]  [address], [place] [address] [phone]  [facility]  [address] [place] [address] [phone]  [facility]  [address] [place], [place] [address] [phone]      Situation ongoing, CSW to continue following and update chart as more information becomes available.      Emily Berger MSW, LCSW  10/27/2023 12:15 PM

## 2023-10-27 NOTE — Progress Notes (Signed)
 Inpatient Psychiatric Referral  Patient was recommended inpatient per Starlyn Patron, NP. There are no available beds at [facility], per [facility] Luke Sprang, RN. Patient was referred to the following out of network facilities:  Destination  Service Provider Request Status Address Phone Fax  [facility]  Pending - Request Sent [address] [place], [place] [address] [phone]  [facility]  Pending - Request Sent [address] [place] [address] [phone]  [facility]  Pending - Request Sent [address], [place] [address] [phone]  [facility]  Pending - Request Sent [address] [place] [address] [phone]  [facility]  Pending - Request [facility] [address] [place] [address] [phone]  [facility]  Pending - Request Sent [address] [place] [address] [phone]  [facility]  Pending - Request Sent [address] [place] [phone]  [facility]  Pending - Request Sent [address] [place] [address] [phone]  [facility]  Pending - Request Sent [address], [place] [address] [phone]  [facility]  Pending - Request Sent [address] [place], [place] [address] [phone]  [facility] Patient Placement  Pending - Request [facility], [place] [phone]  [facility]  Pending - Request Sent [address] [place] [address] [phone]  [facility]  Pending - Request Sent [address] [place], [place] [address] [phone]   [facility]  Pending - Request Sent [place] [phone]    Situation ongoing, CSW to continue following and update chart as more information becomes available.   Harrie Sofia MSW, LCSWA 10/27/2023  10:15PM
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
## 2023-10-27 NOTE — Progress Notes (Signed)
 Patient seen face-to-face today. In room eating breakfast.Alert and oriented with mild confusion. Continues to receive Keflex  for UTI. Continues to endorse emotional disturbances characterized by anxiety, sadness, helplessness.  Antidepressant added: Sertraline 25 mg PO daily if tolerated.

## 2023-10-28 DIAGNOSIS — F03918 Unspecified dementia, unspecified severity, with other behavioral disturbance: Secondary | ICD-10-CM | POA: Diagnosis not present
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
## 2023-10-28 NOTE — Progress Notes (Signed)
 Inpatient Psychiatric Referral  Patient was recommended inpatient per Starlyn Patron NP. There are no available beds at [facility], per [facility] Cherylynn Ernst, RN. Patient was re-faxed to the following out of network facilities:  Destination  Service Provider Request Status Address Phone Fax  [facility]  Pending - Request Sent [address] [place], [place] [address] [phone]  [facility]  Pending - Request Sent [address] [place] [address] [phone]  [facility]  Pending - Request Sent [address] [place] [address] [phone]  [facility]  Pending - Request Sent [address] [place]en Comment [place] [address] [phone]  [facility]  Pending - Request [facility] [address] [place] [address] [phone]  [facility]  Pending - Request Sent [address] [place] [address] [phone]  [facility]  Pending - Request Sent [address] Norbert Solon [place] [phone]  [facility]  Pending - Request Sent [address] [place] [address] [phone]  [facility]  Pending - Request Sent [address], [place] [address] [phone]  [facility]  Pending - Request Sent [address] [place], [place] [address] [phone]  [facility] Patient Placement  Pending - Request [facility] [place] [phone]  [facility]  Pending - Request Sent [address] [place] [address] [phone]  [facility]  Pending - Request Sent [address] [place], [place] [address] [phone]   [facility]  Pending - Request Sent [place] [phone]    Situation ongoing, CSW to continue following and update chart as more information becomes available.   Harrie Sofia MSW, LCSWA 10/28/2023  7:29PM

## 2023-10-28 NOTE — ED Notes (Signed)
 [facility] called pts son Emily Berger) to update him on pts progress and encourage him to contact pts insurance company to inquire about possibly getting a care coordinator involved with the families search for respite care and/or a possible memory care placement. [facility] reiterated to pts son that the [facility] social worker could provide some assistance and direction in their search for a memory care facility but only after pt has been psychiatrically cleared.   [facility] suggested that pt son start the process of searching for a memory care placement so that when the pt is cleared she will have a placement ready. Pts son was appreciative of the information and said that he would inquire about care coordination with the pts insurance company.  Chesley Holt, [facility]  10/28/23

## 2023-10-28 NOTE — ED Notes (Addendum)
 Pt has healed wound with scab resolving distal to sacral area.  Applied barrier cream per pt request.
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
## 2023-10-29 NOTE — Discharge Instructions (Signed)
 [facility]  Emily Berger Email:[email or web address] Phone: [phone]  Please contact Emily who will assist with finding memory care placement. Please ask about home care until your loved one is able to be placed into memory care.

## 2023-10-29 NOTE — ED Provider Notes (Addendum)
 Emergency Medicine Observation Re-evaluation Note  Emily Berger is a 78 y.o. female, seen on rounds today.  Pt initially presented to the ED for complaints of uti and awaiting placement. No new c/o this AM.   Physical Exam  BP (!) 104/56 (BP Location: Right Arm)   Pulse 64   Temp 97.8 F (36.6 C) (Oral)   Resp 16   SpO2 98%  Physical Exam General: resting comfortably.  Cardiac: regular rate.  Lungs: breathing comfortably. Psych: calm.  ED Course / MDM    I have reviewed the labs performed to date as well as medications administered while in observation.  Recent changes in the last 24 hours include ed obs, reassessment.   Plan  Currently it appears [facility] team is pursuing psychiatric placement.    Given history significant dementia, it is not clear that any accepting [facility] facility is a possibility, and/or other therapy/programming is possible - as such, will ask BH team to reassess - if they want to admit patient to [facility] geropsych that may be a possibility - if not, it appears patient may need to return home with adequate care and supervision or go to memory care.   BH team to reassess, and possible TOC consultation.       Bernard Drivers, MD 10/29/23 [phone]

## 2023-10-29 NOTE — ED Notes (Signed)
 [facility] called pts son to obtain his email and send a resource for assisting him with searching for a memory care placement for pt. [facility] emailed the resource information to [email or web address].  Chesley Holt, [facility]  10/29/23

## 2023-10-30 DIAGNOSIS — F03918 Unspecified dementia, unspecified severity, with other behavioral disturbance: Secondary | ICD-10-CM | POA: Diagnosis not present
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
## 2023-10-30 NOTE — ED Provider Notes (Signed)
 Blood pressure (!) 90/49, pulse 71, temperature 98.1 F (36.7 C), temperature source Oral, resp. rate 14, SpO2 94%.   In short, Emily Berger is a 78 y.o. female with a chief complaint of IVC .  Refer to the original H&P for additional details.  The current plan of care is to d/c home with family. Psychiatry has cleared for discharge. Will rescind IVC. Family at bedside to pick up the patient.     Darra Fonda MATSU, MD 10/30/23 2036

## 2023-10-30 NOTE — Progress Notes (Addendum)
 CSW spoke with Emily Berger, pt's son, who informed he was not aware of patient is psychiatrically cleared. CSW informed that the pt is ready for discharge.   CSW confirmed pt received resource for [facility]. Pt stated he asked his wife to take a peek at what was sent. CSW informed she'd request Emily Berger call him. Pt states he and his wife are currently in the process of trying to figure out if pt is at a point of needing permanent placement. He reported the pt has lived with he and his wife for 6 years and states he informed the pt that they will take care of her as long as they can. He reported pt asked to not be placed into a facility. Emily reports they would like to have a time of reflection if pt is placed into respite care. CSW encouraged Emily to speak with Emily Berger about home care if they are not certain they wish to put pt into memory care at this time. Emily Berger verbalized understanding.  Emily will make arrangements for pt to be picked up and contact TOC when he has details. TOC following for discharge details.   CSW outreached to Emily Berger to request he give Emily a call. Emily Berger reports he will call right away.

## 2023-10-30 NOTE — ED Provider Notes (Signed)
 Emergency Medicine Observation Re-evaluation Note  Emily Berger is a 78 y.o. female, seen on rounds today.  Pt initially presented to the ED for complaints of IVC Currently, the patient is well. Pt has no complaint. Nurses have no complaint.  Physical Exam  BP (!) 108/55 (BP Location: Right Arm)   Pulse 74   Temp 98.9 F (37.2 C) (Oral)   Resp 16   SpO2 97%  Physical Exam General: no acute distress Cardiac: regular rate Lungs: no resp distress Psych: calm  ED Course / MDM  EKG:EKG Interpretation Date/Time:  Saturday October 24 2023 22:51:13 EDT Ventricular Rate:  63 PR Interval:  142 QRS Duration:  78 QT Interval:  442 QTC Calculation: 452 R Axis:   47  Text Interpretation: Normal sinus rhythm Normal ECG When compared with ECG of 15-May-2023 13:35, No significant change since last tracing Confirmed by Towana Sharper [phone]) on 10/24/2023 10:52:24 PM  I have reviewed the labs performed to date as well as medications administered while in observation.  Recent changes in the last 24 hours include - no new changes.  Plan  Current plan is for patient to be managed in the ER. She is awaiting placement to memory unit.    Charlyn Sora, MD 10/30/23 [phone]

## 2023-10-30 NOTE — ED Notes (Signed)
 Patient is up at bedside sitting in chair socializing with staff.

## 2023-11-04 ENCOUNTER — Telehealth (HOSPITAL_COMMUNITY): Payer: Self-pay

## 2023-11-04 MED ORDER — HYDROXYZINE HCL 25 MG PO TABS: 25.0000 mg | ORAL_TABLET | Freq: Four times a day (QID) | ORAL | 0 refills | Status: AC | PRN

## 2023-11-04 MED ORDER — SERTRALINE HCL 25 MG PO TABS: 25.0000 mg | ORAL_TABLET | Freq: Every day | ORAL | 0 refills | Status: DC

## 2023-11-04 MED ORDER — RISPERIDONE 0.25 MG PO TABS: 0.2500 mg | ORAL_TABLET | Freq: Every day | ORAL | 0 refills | Status: DC

## 2023-11-04 MED ORDER — DIVALPROEX SODIUM 125 MG PO DR TAB
125.0000 mg | DELAYED_RELEASE_TABLET | Freq: Three times a day (TID) | ORAL | 0 refills | Status: DC
Start: 2023-11-04 — End: 2023-11-12

## 2023-11-04 NOTE — Telephone Encounter (Signed)
 I was asked to send prescriptions for the patient who was seen here in the emergency department a few days ago.  The patient did not have prescription sent to her pharmacy upon discharge.  It was recommended by psychiatry that patient be started on Depakote , hydroxyzine  as needed, risperidone , and sertraline .  These are sent.

## 2023-11-05 ENCOUNTER — Telehealth: Payer: Self-pay | Admitting: *Deleted

## 2023-11-05 NOTE — Telephone Encounter (Signed)
 Pt daughter-in-law called regarding pt needing to have Rx started in ED sent to pharmacy as pt is becoming agitated without them.  Zikeria Keough J. Debarah, BSN, RN, NCM  Transitions of Care  Nurse Case Manager  [facility] Emergency Departments  Operative Services  [phone]

## 2023-11-07 ENCOUNTER — Encounter (HOSPITAL_COMMUNITY): Payer: Self-pay

## 2023-11-07 ENCOUNTER — Emergency Department (HOSPITAL_COMMUNITY)

## 2023-11-07 ENCOUNTER — Other Ambulatory Visit: Payer: Self-pay

## 2023-11-07 ENCOUNTER — Inpatient Hospital Stay (HOSPITAL_COMMUNITY)
Admission: EM | Admit: 2023-11-07 | Discharge: 2023-11-12 | DRG: 100 | Disposition: A | Discharge status: Home Health | Attending: Internal Medicine | Admitting: Internal Medicine

## 2023-11-07 DIAGNOSIS — F32A Depression, unspecified: Secondary | ICD-10-CM | POA: Diagnosis present

## 2023-11-07 DIAGNOSIS — Z87440 Personal history of urinary (tract) infections: Secondary | ICD-10-CM

## 2023-11-07 DIAGNOSIS — Z885 Allergy status to narcotic agent status: Secondary | ICD-10-CM

## 2023-11-07 DIAGNOSIS — F411 Generalized anxiety disorder: Secondary | ICD-10-CM | POA: Diagnosis present

## 2023-11-07 DIAGNOSIS — R569 Unspecified convulsions: Secondary | ICD-10-CM

## 2023-11-07 DIAGNOSIS — W19XXXA Unspecified fall, initial encounter: Secondary | ICD-10-CM

## 2023-11-07 DIAGNOSIS — W010XXA Fall on same level from slipping, tripping and stumbling without subsequent striking against object, initial encounter: Secondary | ICD-10-CM | POA: Diagnosis present

## 2023-11-07 DIAGNOSIS — Z888 Allergy status to other drugs, medicaments and biological substances status: Secondary | ICD-10-CM

## 2023-11-07 DIAGNOSIS — F0284 Dementia in other diseases classified elsewhere, unspecified severity, with anxiety: Secondary | ICD-10-CM | POA: Diagnosis present

## 2023-11-07 DIAGNOSIS — R410 Disorientation, unspecified: Secondary | ICD-10-CM | POA: Diagnosis not present

## 2023-11-07 DIAGNOSIS — S0083XA Contusion of other part of head, initial encounter: Secondary | ICD-10-CM | POA: Diagnosis present

## 2023-11-07 DIAGNOSIS — F02811 Dementia in other diseases classified elsewhere, unspecified severity, with agitation: Secondary | ICD-10-CM | POA: Diagnosis present

## 2023-11-07 DIAGNOSIS — F0283 Dementia in other diseases classified elsewhere, unspecified severity, with mood disturbance: Secondary | ICD-10-CM | POA: Diagnosis present

## 2023-11-07 DIAGNOSIS — Y9301 Activity, walking, marching and hiking: Secondary | ICD-10-CM | POA: Diagnosis present

## 2023-11-07 DIAGNOSIS — R32 Unspecified urinary incontinence: Secondary | ICD-10-CM | POA: Diagnosis present

## 2023-11-07 DIAGNOSIS — F05 Delirium due to known physiological condition: Secondary | ICD-10-CM | POA: Diagnosis present

## 2023-11-07 DIAGNOSIS — Z79899 Other long term (current) drug therapy: Secondary | ICD-10-CM

## 2023-11-07 DIAGNOSIS — E039 Hypothyroidism, unspecified: Secondary | ICD-10-CM | POA: Diagnosis present

## 2023-11-07 DIAGNOSIS — F02818 Dementia in other diseases classified elsewhere, unspecified severity, with other behavioral disturbance: Secondary | ICD-10-CM | POA: Diagnosis present

## 2023-11-07 DIAGNOSIS — G40909 Epilepsy, unspecified, not intractable, without status epilepticus: Secondary | ICD-10-CM | POA: Diagnosis not present

## 2023-11-07 DIAGNOSIS — Y92018 Other place in single-family (private) house as the place of occurrence of the external cause: Secondary | ICD-10-CM

## 2023-11-07 DIAGNOSIS — N390 Urinary tract infection, site not specified: Secondary | ICD-10-CM | POA: Diagnosis present

## 2023-11-07 DIAGNOSIS — Z90710 Acquired absence of both cervix and uterus: Secondary | ICD-10-CM

## 2023-11-07 DIAGNOSIS — Z9049 Acquired absence of other specified parts of digestive tract: Secondary | ICD-10-CM

## 2023-11-07 DIAGNOSIS — E559 Vitamin D deficiency, unspecified: Secondary | ICD-10-CM | POA: Diagnosis present

## 2023-11-07 DIAGNOSIS — Z79890 Hormone replacement therapy (postmenopausal): Secondary | ICD-10-CM

## 2023-11-07 DIAGNOSIS — R4182 Altered mental status, unspecified: Secondary | ICD-10-CM | POA: Insufficient documentation

## 2023-11-07 DIAGNOSIS — B9620 Unspecified Escherichia coli [E. coli] as the cause of diseases classified elsewhere: Secondary | ICD-10-CM | POA: Diagnosis present

## 2023-11-07 DIAGNOSIS — I609 Nontraumatic subarachnoid hemorrhage, unspecified: Principal | ICD-10-CM

## 2023-11-07 DIAGNOSIS — Z91040 Latex allergy status: Secondary | ICD-10-CM

## 2023-11-07 DIAGNOSIS — S066X0A Traumatic subarachnoid hemorrhage without loss of consciousness, initial encounter: Secondary | ICD-10-CM | POA: Diagnosis present

## 2023-11-07 DIAGNOSIS — R531 Weakness: Secondary | ICD-10-CM | POA: Diagnosis present

## 2023-11-07 DIAGNOSIS — M25552 Pain in left hip: Secondary | ICD-10-CM | POA: Diagnosis present

## 2023-11-07 DIAGNOSIS — F03918 Unspecified dementia, unspecified severity, with other behavioral disturbance: Secondary | ICD-10-CM | POA: Diagnosis not present

## 2023-11-07 DIAGNOSIS — E785 Hyperlipidemia, unspecified: Secondary | ICD-10-CM | POA: Diagnosis present

## 2023-11-07 DIAGNOSIS — Z8659 Personal history of other mental and behavioral disorders: Secondary | ICD-10-CM

## 2023-11-07 DIAGNOSIS — Q210 Ventricular septal defect: Secondary | ICD-10-CM

## 2023-11-07 DIAGNOSIS — G309 Alzheimer's disease, unspecified: Secondary | ICD-10-CM | POA: Diagnosis present

## 2023-11-07 LAB — COMPREHENSIVE METABOLIC PANEL WITH GFR
ALT: 24 U/L (ref 0–44)
AST: 25 U/L (ref 15–41)
Albumin: 3.4 g/dL — ABNORMAL LOW (ref 3.5–5.0)
Alkaline Phosphatase: 61 U/L (ref 38–126)
Anion gap: 9 (ref 5–15)
BUN: 11 mg/dL (ref 8–23)
CO2: 24 mmol/L (ref 22–32)
Calcium: 8.9 mg/dL (ref 8.9–10.3)
Chloride: 104 mmol/L (ref 98–111)
Creatinine, Ser: 0.74 mg/dL (ref 0.44–1.00)
GFR, Estimated: >60 mL/min (ref >60)
Glucose, Bld: 100 mg/dL — ABNORMAL HIGH (ref 70–99)
Potassium: 3.7 mmol/L (ref 3.5–5.1)
Sodium: 137 mmol/L (ref 135–145)
Total Bilirubin: 0.9 mg/dL (ref 0.0–1.2)
Total Protein: 6.5 g/dL (ref 6.5–8.1)

## 2023-11-07 LAB — CBC WITH DIFFERENTIAL/PLATELET
Abs Immature Granulocytes: 0.03 K/uL (ref 0.00–0.07)
Basophils Absolute: 0 K/uL (ref 0.0–0.1)
Basophils Relative: 1 %
Eosinophils Absolute: 0 K/uL (ref 0.0–0.5)
Eosinophils Relative: 1 %
HCT: 36.6 % (ref 36.0–46.0)
Hemoglobin: 12.4 g/dL (ref 12.0–15.0)
Immature Granulocytes: 1 %
Lymphocytes Relative: 16 %
Lymphs Abs: 1 K/uL (ref 0.7–4.0)
MCH: 28.5 pg (ref 26.0–34.0)
MCHC: 33.9 g/dL (ref 30.0–36.0)
MCV: 84.1 fL (ref 80.0–100.0)
Monocytes Absolute: 0.5 K/uL (ref 0.1–1.0)
Monocytes Relative: 7 %
Neutro Abs: 4.9 K/uL (ref 1.7–7.7)
Neutrophils Relative %: 74 %
Platelets: 253 K/uL (ref 150–400)
RBC: 4.35 MIL/uL (ref 3.87–5.11)
RDW: 14.9 % (ref 11.5–15.5)
WBC: 6.5 K/uL (ref 4.0–10.5)
nRBC: 0 % (ref 0.0–0.2)

## 2023-11-07 LAB — VALPROIC ACID LEVEL: Valproic Acid Lvl: 40 ug/mL — ABNORMAL LOW (ref 50–100)

## 2023-11-07 MED ORDER — LACTATED RINGERS IV BOLUS
1000.0000 mL | Freq: Once | INTRAVENOUS | Status: AC
  Administered 2023-11-08: 1000 mL via INTRAVENOUS

## 2023-11-07 MED ORDER — VALPROATE SODIUM 100 MG/ML IV SOLN
500.0000 mg | Freq: Once | INTRAVENOUS | Status: AC
  Administered 2023-11-07: 500 mg via INTRAVENOUS
  Filled 2023-11-07: qty 5

## 2023-11-07 MED ORDER — SODIUM CHLORIDE 0.9 % IV SOLN
200.0000 mg | Freq: Once | INTRAVENOUS | Status: AC
  Administered 2023-11-08: 200 mg via INTRAVENOUS
  Filled 2023-11-07: qty 20

## 2023-11-07 NOTE — ED Provider Notes (Signed)
 [place] EMERGENCY DEPARTMENT AT [facility] Provider Note   CSN: [id number] Arrival date & time: 11/07/23  8190     Patient presents with: Emily Berger   Emily Berger is a 78 y.o. female.   HPI     78 year old patient comes in with chief complaint of fall.  Patient accompanied by daughter-in-law, who also has POA.  Patient has history of advanced dementia.  It appears, the patient also has history of seizure-like activity and was started on Depakote  100 mg twice daily at some point, currently she is taking Depakote  125 x 3 times daily.  Patient recently admitted to the hospital for behavioral disturbance.  According to the patient's daughter-in-law, patient was in the bathroom.  She stepped out of the bathroom with her pants down.  She then proceeded to fall down.  In the process she struck the back of her head and then she started having seizure-like activity that lasted for about 2 minutes.  Patient was confused thereafter.  She wet herself as well.  Patient has been acting a little bit more confused over the last few days.  She was recently treated for UTI.  Prior to Admission medications   Medication Sig Start Date End Date Taking? Authorizing Provider  cholecalciferol  (VITAMIN D3) 25 MCG (1000 UT) tablet Take 1,000 Units by mouth daily.    [provider]  divalproex  (DEPAKOTE ) 125 MG DR tablet Take 1 tablet (125 mg total) by mouth 3 (three) times daily. 11/04/23   Ula Prentice SAUNDERS, MD  donepezil  (ARICEPT ) 10 MG tablet Take 10 mg by mouth at bedtime. 09/01/23   [provider]  ezetimibe  (ZETIA ) 10 MG tablet Take 10 mg by mouth at bedtime. 08/10/23   [provider]  hydrOXYzine  (ATARAX ) 25 MG tablet Take 1 tablet (25 mg total) by mouth every 6 (six) hours as needed for anxiety. 11/04/23   Ula Prentice SAUNDERS, MD  levothyroxine  (SYNTHROID ) 50 MCG tablet Take 50 mcg by mouth at bedtime.    [provider]  LORazepam  (ATIVAN ) 1 MG tablet Take 1 mg by  mouth daily as needed. Patient not taking: Reported on 10/25/2023 09/01/23   [provider]  memantine  (NAMENDA ) 10 MG tablet Take 10 mg by mouth 2 (two) times daily. 09/01/23 08/31/24  [provider]  QUEtiapine  (SEROQUEL ) 50 MG tablet Take 50 mg by mouth at bedtime. 09/01/23   [provider]  risperiDONE  (RISPERDAL ) 0.25 MG tablet Take 1 tablet (0.25 mg total) by mouth at bedtime. 11/04/23   Ula Prentice SAUNDERS, MD  sertraline  (ZOLOFT ) 25 MG tablet Take 1 tablet (25 mg total) by mouth daily. 11/04/23   Ula Prentice SAUNDERS, MD    Allergies: Latex, Ambien [zolpidem tartrate], Latex, and Percocet [oxycodone -acetaminophen ]    Review of Systems  All other systems reviewed and are negative.   Updated Vital Signs BP 125/61   Pulse 65   Temp 97.9 F (36.6 C)   Resp 19   Ht 5' 6 (1.676 m)   Wt 53 kg   SpO2 96%   BMI 18.86 kg/m   Physical Exam Vitals and nursing note reviewed.  Constitutional:      Appearance: She is well-developed.  HENT:     Head: Atraumatic.  Eyes:     Extraocular Movements: Extraocular movements intact.     Pupils: Pupils are equal, round, and reactive to light.  Cardiovascular:     Rate and Rhythm: Normal rate.  Pulmonary:     Effort:  Pulmonary effort is normal.  Musculoskeletal:     Cervical back: Normal range of motion and neck supple.     Comments: Head to toe evaluation shows no hematoma, bleeding of the scalp, no facial abrasions, no spine step offs, crepitus of the chest or neck, no tenderness to palpation of the bilateral upper and lower extremities, no gross deformities, no chest tenderness, no pelvic pain.   Skin:    General: Skin is warm and dry.  Neurological:     Mental Status: She is alert. Mental status is at baseline. She is disoriented.     Cranial Nerves: No cranial nerve deficit.     Sensory: No sensory deficit.     Motor: No weakness.     Coordination: Coordination normal.     (all labs ordered are listed, but only  abnormal results are displayed) Labs Reviewed  COMPREHENSIVE METABOLIC PANEL WITH GFR - Abnormal; Notable for the following components:      Result Value   Glucose, Bld 100 (*)    Albumin 3.4 (*)    All other components within normal limits  VALPROIC  ACID LEVEL - Abnormal; Notable for the following components:   Valproic  Acid Lvl 40 (*)    All other components within normal limits  URINE CULTURE  CBC WITH DIFFERENTIAL/PLATELET  URINALYSIS, ROUTINE W REFLEX MICROSCOPIC    EKG: None  Radiology: CT Head Wo Contrast Addendum Date: 11/07/2023 ADDENDUM REPORT: 11/07/2023 19:35 ADDENDUM: Small RIGHT frontal lobe subarachnoid hemorrhage. (NOT left as stated in findings and impression). These results were called by telephone at the time of interpretation on 11/07/2023 at 7:35 pm to provider [facility] , who verbally acknowledged these results. Electronically Signed   By: Morgane  Naveau M.D.   On: 11/07/2023 19:35   Result Date: 11/07/2023 CLINICAL DATA:  Witnessed fall onto left side. Got tripped up after walking down the hallway. Has a golf ball sized hematoma on left side of her head EXAM: CT HEAD WITHOUT CONTRAST CT CERVICAL SPINE WITHOUT CONTRAST TECHNIQUE: Multidetector CT imaging of the head and cervical spine was performed following the standard protocol without intravenous contrast. Multiplanar CT image reconstructions of the cervical spine were also generated. RADIATION DOSE REDUCTION: This exam was performed according to the departmental dose-optimization program which includes automated exposure control, adjustment of the mA and/or kV according to patient size and/or use of iterative reconstruction technique. COMPARISON:  CT head 10/24/2023 FINDINGS: CT HEAD FINDINGS Brain: Patchy and confluent areas of decreased attenuation are noted throughout the deep and periventricular white matter of the cerebral hemispheres bilaterally, compatible with chronic microvascular ischemic disease. No  evidence of large-territorial acute infarction. No parenchymal hemorrhage. No mass lesion. Small left frontal lobe subarachnoid hemorrhage (3:20-24). No mass effect or midline shift. No hydrocephalus. Basilar cisterns are patent. Vascular: No hyperdense vessel. Atherosclerotic calcifications are present within the cavernous internal carotid and vertebral arteries. Skull: No acute fracture or focal lesion. Sinuses/Orbits: Paranasal sinuses and mastoid air cells are clear. The orbits are unremarkable. Other: Left frontal scalp 4 mm hematoma formation. CT CERVICAL SPINE FINDINGS Alignment: Reversal of normal cervical lordosis centered at the C4 through C6 levels. Skull base and vertebrae: Multilevel moderate degenerative changes spine most prominent at the C4-C6 levels. No acute fracture. No aggressive appearing focal osseous lesion or focal pathologic process. Soft tissues and spinal canal: No prevertebral fluid or swelling. No visible canal hematoma. Upper chest: Unremarkable. Other: Heterogeneous enlarged bilateral thyroid  glands. IMPRESSION: 1. Small left frontal lobe subarachnoid hemorrhage. 2.  No acute displaced fracture or traumatic listhesis of the cervical spine. 3. Heterogeneous enlarged bilateral thyroid  glands. Recommend thyroid  ultrasound (ref: J Am Coll Radiol. 2015 Feb;12(2): 143-50). Electronically Signed: By: Morgane  Naveau M.D. On: 11/07/2023 19:28   CT Cervical Spine Wo Contrast Addendum Date: 11/07/2023 ADDENDUM REPORT: 11/07/2023 19:35 ADDENDUM: Small RIGHT frontal lobe subarachnoid hemorrhage. (NOT left as stated in findings and impression). These results were called by telephone at the time of interpretation on 11/07/2023 at 7:35 pm to provider [facility] , who verbally acknowledged these results. Electronically Signed   By: Morgane  Naveau M.D.   On: 11/07/2023 19:35   Result Date: 11/07/2023 CLINICAL DATA:  Witnessed fall onto left side. Got tripped up after walking down the hallway. Has  a golf ball sized hematoma on left side of her head EXAM: CT HEAD WITHOUT CONTRAST CT CERVICAL SPINE WITHOUT CONTRAST TECHNIQUE: Multidetector CT imaging of the head and cervical spine was performed following the standard protocol without intravenous contrast. Multiplanar CT image reconstructions of the cervical spine were also generated. RADIATION DOSE REDUCTION: This exam was performed according to the departmental dose-optimization program which includes automated exposure control, adjustment of the mA and/or kV according to patient size and/or use of iterative reconstruction technique. COMPARISON:  CT head 10/24/2023 FINDINGS: CT HEAD FINDINGS Brain: Patchy and confluent areas of decreased attenuation are noted throughout the deep and periventricular white matter of the cerebral hemispheres bilaterally, compatible with chronic microvascular ischemic disease. No evidence of large-territorial acute infarction. No parenchymal hemorrhage. No mass lesion. Small left frontal lobe subarachnoid hemorrhage (3:20-24). No mass effect or midline shift. No hydrocephalus. Basilar cisterns are patent. Vascular: No hyperdense vessel. Atherosclerotic calcifications are present within the cavernous internal carotid and vertebral arteries. Skull: No acute fracture or focal lesion. Sinuses/Orbits: Paranasal sinuses and mastoid air cells are clear. The orbits are unremarkable. Other: Left frontal scalp 4 mm hematoma formation. CT CERVICAL SPINE FINDINGS Alignment: Reversal of normal cervical lordosis centered at the C4 through C6 levels. Skull base and vertebrae: Multilevel moderate degenerative changes spine most prominent at the C4-C6 levels. No acute fracture. No aggressive appearing focal osseous lesion or focal pathologic process. Soft tissues and spinal canal: No prevertebral fluid or swelling. No visible canal hematoma. Upper chest: Unremarkable. Other: Heterogeneous enlarged bilateral thyroid  glands. IMPRESSION: 1. Small  left frontal lobe subarachnoid hemorrhage. 2. No acute displaced fracture or traumatic listhesis of the cervical spine. 3. Heterogeneous enlarged bilateral thyroid  glands. Recommend thyroid  ultrasound (ref: J Am Coll Radiol. 2015 Feb;12(2): 143-50). Electronically Signed: By: Morgane  Naveau M.D. On: 11/07/2023 19:28     .Critical Care  Performed by: Charlyn Sora, MD Authorized by: Charlyn Sora, MD   Critical care provider statement:    Critical care time (minutes):  48   Critical care was necessary to treat or prevent imminent or life-threatening deterioration of the following conditions:  CNS failure or compromise   Critical care was time spent personally by me on the following activities:  Development of treatment plan with patient or surrogate, discussions with consultants, evaluation of patient's response to treatment, examination of patient, ordering and review of laboratory studies, ordering and review of radiographic studies, ordering and performing treatments and interventions, pulse oximetry, re-evaluation of patient's condition, review of old charts and obtaining history from patient or surrogate    Medications Ordered in the ED  lacosamide  (VIMPAT ) 200 mg in sodium chloride  0.9 % 25 mL IVPB (has no administration in time range)  valproate (DEPACON ) 500 mg  in dextrose  5 % 50 mL IVPB (0 mg Intravenous Stopped 11/07/23 2311)    Clinical Course as of 11/07/23 2351  Sat Nov 07, 2023  2314 I just talked to the patient and the family.  Patient's UA is pending.  She is going to get some IV fluids. Family concerned that patient has been altered, had a fall today.  We have decided to admit the patient.  UA pending.  Patient however just had a seizure. We will get repeat CT head.  Patient will be admitted to the hospital.  [AN]  2350 I discussed the case with Dr. Salman K.  From neurology.  He recommends that we load patient with Vimpat  200 mg now.  We need to increase patient's  Depakote  to 500 mg twice daily.  Patient can be admitted to [facility], and neurology can be reconsulted if patient starts having more seizures. [AN]  2351 Patient's care will be signed out to incoming team.  She will need admission after the repeat CT head. [AN]    Clinical Course User Index [AN] Charlyn Sora, MD                                 Medical Decision Making Amount and/or Complexity of Data Reviewed Labs: ordered. Radiology: ordered.   78 year old patient comes in after sustaining what appears to be a mechanical fall. Pertinent past medical includes advanced dementia, recent initiation of psychotropic medications and also UTI.  Patient also has questionable seizure-like activity per our records.  She is currently taking Depakote  125 mg 3 times daily.  In the past he was prescribed this for 100 mg twice daily for seizure-like activity. Collateral history provided by patient's daughter-in-law.   Based on my history and exam, differential diagnosis includes: - Traumatic brain injury including intracranial hemorrhage - Long bone fractures - Contusions - Soft tissue injury - Concussion  Based on the initial assessment, the following workup was initiated plan is to get basic labs, UA with urine culture and also CT scan of the head and C-spine.  I have independently interpreted the following imaging from the perspective of acute trauma: CT scan of the brain and the results indicate right-sided subarachnoid hemorrhage.  I consulted neurosurgery and spoke with Dr. Mavis neurosurgery.  He indicated that the subarachnoid is likely inconsequential from neurosurgery perspective.  He recommends no repeat CT scan either.  Patient can be discharged with no need for neurosurgery follow-up.  For the seizure-like activity, I recommended that I speak with neurology.  I spoke with Dr. Sal K.  And indicates that we can just give patient a Depakote  load right now and increase  patient's Depakote  to 250 mg twice daily.       Final diagnoses:  SAH (subarachnoid hemorrhage) (HCC)  Fall, initial encounter  Seizure-like activity [facility])    ED Discharge Orders     None          Charlyn Sora, MD 11/07/23 2351

## 2023-11-07 NOTE — ED Notes (Signed)
 EDP at bedside, per NT family called out stating pt had a seizure, pt postictal upon entering room, however, pt is alert and responsive

## 2023-11-07 NOTE — Consult Note (Signed)
 I was contacted by Dr. Charlyn regarding this patient.  She is a 78 year old demented white female who is not anticoagulated who took a witnessed fall at home this afternoon.  She was brought to [facility] ER where CT scans of her head and neck were obtained.   By report she is at her baseline except for perhaps some mildly increased confusion.  Urinalysis is pending. An insignificant tiny subarachnoid hemorrhage was diagnosed.    No further CT scans, neurosurgical intervention or follow-up is indicated .

## 2023-11-07 NOTE — ED Notes (Signed)
 In and out attempted with no success, bladder scan with 72ml in bladder

## 2023-11-07 NOTE — ED Triage Notes (Addendum)
 Patient BIB [facility] from home. Called out for a fall with head injury. Witnessed fall onto left side. Got tripped up after walking down the hallway. Has a golf ball sized hematoma on left side of her head per EMS. No blood thinners. Patient has left sided hip pain. Increased lethargy, weakness, altered mental status for 3 days per caregiver. Decreased appetite. Has alzheimer's but increased altered mental status from baseline per family.

## 2023-11-08 ENCOUNTER — Encounter (HOSPITAL_COMMUNITY): Payer: Self-pay | Admitting: Internal Medicine

## 2023-11-08 ENCOUNTER — Emergency Department (HOSPITAL_COMMUNITY)

## 2023-11-08 ENCOUNTER — Other Ambulatory Visit: Payer: Self-pay

## 2023-11-08 DIAGNOSIS — R82998 Other abnormal findings in urine: Secondary | ICD-10-CM | POA: Diagnosis not present

## 2023-11-08 DIAGNOSIS — R32 Unspecified urinary incontinence: Secondary | ICD-10-CM | POA: Diagnosis present

## 2023-11-08 DIAGNOSIS — Z8659 Personal history of other mental and behavioral disorders: Secondary | ICD-10-CM

## 2023-11-08 DIAGNOSIS — E039 Hypothyroidism, unspecified: Secondary | ICD-10-CM | POA: Insufficient documentation

## 2023-11-08 DIAGNOSIS — R4182 Altered mental status, unspecified: Secondary | ICD-10-CM | POA: Diagnosis not present

## 2023-11-08 DIAGNOSIS — M25552 Pain in left hip: Secondary | ICD-10-CM | POA: Diagnosis present

## 2023-11-08 DIAGNOSIS — W010XXA Fall on same level from slipping, tripping and stumbling without subsequent striking against object, initial encounter: Secondary | ICD-10-CM | POA: Diagnosis present

## 2023-11-08 DIAGNOSIS — Z885 Allergy status to narcotic agent status: Secondary | ICD-10-CM | POA: Diagnosis not present

## 2023-11-08 DIAGNOSIS — E038 Other specified hypothyroidism: Secondary | ICD-10-CM

## 2023-11-08 DIAGNOSIS — F02818 Dementia in other diseases classified elsewhere, unspecified severity, with other behavioral disturbance: Secondary | ICD-10-CM | POA: Diagnosis present

## 2023-11-08 DIAGNOSIS — B9620 Unspecified Escherichia coli [E. coli] as the cause of diseases classified elsewhere: Secondary | ICD-10-CM | POA: Diagnosis present

## 2023-11-08 DIAGNOSIS — Q210 Ventricular septal defect: Secondary | ICD-10-CM | POA: Diagnosis not present

## 2023-11-08 DIAGNOSIS — E785 Hyperlipidemia, unspecified: Secondary | ICD-10-CM | POA: Diagnosis present

## 2023-11-08 DIAGNOSIS — F03918 Unspecified dementia, unspecified severity, with other behavioral disturbance: Secondary | ICD-10-CM

## 2023-11-08 DIAGNOSIS — G40909 Epilepsy, unspecified, not intractable, without status epilepticus: Secondary | ICD-10-CM | POA: Diagnosis present

## 2023-11-08 DIAGNOSIS — S066X0A Traumatic subarachnoid hemorrhage without loss of consciousness, initial encounter: Secondary | ICD-10-CM | POA: Diagnosis present

## 2023-11-08 DIAGNOSIS — R410 Disorientation, unspecified: Secondary | ICD-10-CM | POA: Diagnosis present

## 2023-11-08 DIAGNOSIS — F411 Generalized anxiety disorder: Secondary | ICD-10-CM

## 2023-11-08 DIAGNOSIS — F32A Depression, unspecified: Secondary | ICD-10-CM | POA: Diagnosis present

## 2023-11-08 DIAGNOSIS — F0283 Dementia in other diseases classified elsewhere, unspecified severity, with mood disturbance: Secondary | ICD-10-CM | POA: Diagnosis present

## 2023-11-08 DIAGNOSIS — I609 Nontraumatic subarachnoid hemorrhage, unspecified: Secondary | ICD-10-CM

## 2023-11-08 DIAGNOSIS — Z79890 Hormone replacement therapy (postmenopausal): Secondary | ICD-10-CM | POA: Diagnosis not present

## 2023-11-08 DIAGNOSIS — F05 Delirium due to known physiological condition: Secondary | ICD-10-CM | POA: Diagnosis present

## 2023-11-08 DIAGNOSIS — R531 Weakness: Secondary | ICD-10-CM | POA: Diagnosis present

## 2023-11-08 DIAGNOSIS — Z79899 Other long term (current) drug therapy: Secondary | ICD-10-CM | POA: Diagnosis not present

## 2023-11-08 DIAGNOSIS — S0083XA Contusion of other part of head, initial encounter: Secondary | ICD-10-CM | POA: Diagnosis present

## 2023-11-08 DIAGNOSIS — Y9301 Activity, walking, marching and hiking: Secondary | ICD-10-CM | POA: Diagnosis present

## 2023-11-08 DIAGNOSIS — N390 Urinary tract infection, site not specified: Secondary | ICD-10-CM | POA: Diagnosis present

## 2023-11-08 DIAGNOSIS — E559 Vitamin D deficiency, unspecified: Secondary | ICD-10-CM | POA: Diagnosis present

## 2023-11-08 DIAGNOSIS — F02811 Dementia in other diseases classified elsewhere, unspecified severity, with agitation: Secondary | ICD-10-CM | POA: Diagnosis present

## 2023-11-08 DIAGNOSIS — R569 Unspecified convulsions: Secondary | ICD-10-CM

## 2023-11-08 DIAGNOSIS — Z87440 Personal history of urinary (tract) infections: Secondary | ICD-10-CM | POA: Diagnosis not present

## 2023-11-08 DIAGNOSIS — G309 Alzheimer's disease, unspecified: Secondary | ICD-10-CM | POA: Diagnosis present

## 2023-11-08 DIAGNOSIS — F0284 Dementia in other diseases classified elsewhere, unspecified severity, with anxiety: Secondary | ICD-10-CM | POA: Diagnosis present

## 2023-11-08 DIAGNOSIS — Y92018 Other place in single-family (private) house as the place of occurrence of the external cause: Secondary | ICD-10-CM | POA: Diagnosis not present

## 2023-11-08 LAB — COMPREHENSIVE METABOLIC PANEL WITH GFR
ALT: 20 U/L (ref 0–44)
AST: 21 U/L (ref 15–41)
Albumin: 3.1 g/dL — ABNORMAL LOW (ref 3.5–5.0)
Alkaline Phosphatase: 58 U/L (ref 38–126)
Anion gap: 10 (ref 5–15)
BUN: 11 mg/dL (ref 8–23)
CO2: 23 mmol/L (ref 22–32)
Calcium: 8.4 mg/dL — ABNORMAL LOW (ref 8.9–10.3)
Chloride: 103 mmol/L (ref 98–111)
Creatinine, Ser: 0.69 mg/dL (ref 0.44–1.00)
GFR, Estimated: >60 mL/min (ref >60)
Glucose, Bld: 116 mg/dL — ABNORMAL HIGH (ref 70–99)
Potassium: 3.4 mmol/L — ABNORMAL LOW (ref 3.5–5.1)
Sodium: 136 mmol/L (ref 135–145)
Total Bilirubin: 1 mg/dL (ref 0.0–1.2)
Total Protein: 5.8 g/dL — ABNORMAL LOW (ref 6.5–8.1)

## 2023-11-08 LAB — URINALYSIS, ROUTINE W REFLEX MICROSCOPIC
Bacteria, UA: NONE SEEN
Bilirubin Urine: NEGATIVE
Glucose, UA: NEGATIVE mg/dL
Hgb urine dipstick: NEGATIVE
Ketones, ur: 5 mg/dL — AB
Nitrite: POSITIVE — AB
Protein, ur: NEGATIVE mg/dL
Specific Gravity, Urine: 1.015 (ref 1.005–1.030)
pH: 6 (ref 5.0–8.0)

## 2023-11-08 LAB — CBC
HCT: 34.5 % — ABNORMAL LOW (ref 36.0–46.0)
Hemoglobin: 11.6 g/dL — ABNORMAL LOW (ref 12.0–15.0)
MCH: 28.2 pg (ref 26.0–34.0)
MCHC: 33.6 g/dL (ref 30.0–36.0)
MCV: 83.7 fL (ref 80.0–100.0)
Platelets: 207 K/uL (ref 150–400)
RBC: 4.12 MIL/uL (ref 3.87–5.11)
RDW: 15 % (ref 11.5–15.5)
WBC: 8.7 K/uL (ref 4.0–10.5)
nRBC: 0 % (ref 0.0–0.2)

## 2023-11-08 LAB — AMMONIA: Ammonia: <13 umol/L (ref 9–35)

## 2023-11-08 MED ORDER — POTASSIUM CHLORIDE CRYS ER 20 MEQ PO TBCR
40.0000 meq | EXTENDED_RELEASE_TABLET | Freq: Once | ORAL | Status: AC
  Administered 2023-11-08: 40 meq via ORAL
  Filled 2023-11-08: qty 2

## 2023-11-08 MED ORDER — DONEPEZIL HCL 10 MG PO TABS
10.0000 mg | ORAL_TABLET | Freq: Every day | ORAL | Status: DC
  Administered 2023-11-08 – 2023-11-11 (×4): 10 mg via ORAL
  Filled 2023-11-08 (×4): qty 1

## 2023-11-08 MED ORDER — ACETAMINOPHEN 325 MG PO TABS: 650.0000 mg | ORAL_TABLET | Freq: Four times a day (QID) | ORAL | Status: DC | PRN

## 2023-11-08 MED ORDER — SODIUM CHLORIDE 0.9 % IV SOLN: 250.0000 mL | INTRAVENOUS | Status: AC | PRN

## 2023-11-08 MED ORDER — BREXPIPRAZOLE 1 MG PO TABS
0.5000 mg | ORAL_TABLET | Freq: Every day | ORAL | Status: DC
  Administered 2023-11-08 – 2023-11-12 (×5): 0.5 mg via ORAL
  Filled 2023-11-08 (×5): qty 0.5

## 2023-11-08 MED ORDER — EZETIMIBE 10 MG PO TABS
10.0000 mg | ORAL_TABLET | Freq: Every day | ORAL | Status: DC
  Administered 2023-11-08 – 2023-11-11 (×4): 10 mg via ORAL
  Filled 2023-11-08 (×4): qty 1

## 2023-11-08 MED ORDER — DEXTROSE IN LACTATED RINGERS 5 % IV SOLN: INTRAVENOUS | Status: DC

## 2023-11-08 MED ORDER — QUETIAPINE FUMARATE 50 MG PO TABS: 50.0000 mg | ORAL_TABLET | Freq: Every day | ORAL | Status: DC

## 2023-11-08 MED ORDER — SODIUM CHLORIDE 0.9% FLUSH
3.0000 mL | Freq: Two times a day (BID) | INTRAVENOUS | Status: DC
  Administered 2023-11-08 – 2023-11-12 (×10): 3 mL via INTRAVENOUS

## 2023-11-08 MED ORDER — HYDROXYZINE HCL 25 MG PO TABS: 25.0000 mg | ORAL_TABLET | Freq: Four times a day (QID) | ORAL | Status: DC | PRN

## 2023-11-08 MED ORDER — SODIUM CHLORIDE 0.9% FLUSH: 3.0000 mL | INTRAVENOUS | Status: DC | PRN

## 2023-11-08 MED ORDER — LACTATED RINGERS IV SOLN: INTRAVENOUS | Status: AC

## 2023-11-08 MED ORDER — LORAZEPAM 2 MG/ML IJ SOLN: 2.0000 mg | Freq: Four times a day (QID) | INTRAMUSCULAR | Status: DC | PRN

## 2023-11-08 MED ORDER — SERTRALINE HCL 50 MG PO TABS: 25.0000 mg | ORAL_TABLET | Freq: Every day | ORAL | Status: DC

## 2023-11-08 MED ORDER — VALPROATE SODIUM 100 MG/ML IV SOLN
500.0000 mg | Freq: Two times a day (BID) | INTRAVENOUS | Status: DC
  Administered 2023-11-08 – 2023-11-09 (×3): 500 mg via INTRAVENOUS
  Filled 2023-11-08 (×5): qty 5

## 2023-11-08 MED ORDER — ONDANSETRON HCL 4 MG/2ML IJ SOLN
4.0000 mg | Freq: Four times a day (QID) | INTRAMUSCULAR | Status: DC | PRN
  Administered 2023-11-11: 4 mg via INTRAVENOUS
  Filled 2023-11-08: qty 2

## 2023-11-08 MED ORDER — SODIUM CHLORIDE 0.9% FLUSH
3.0000 mL | Freq: Two times a day (BID) | INTRAVENOUS | Status: DC
  Administered 2023-11-08 – 2023-11-12 (×7): 3 mL via INTRAVENOUS

## 2023-11-08 MED ORDER — OLANZAPINE 10 MG IM SOLR: 1.5000 mg | Freq: Two times a day (BID) | INTRAMUSCULAR | Status: DC | PRN

## 2023-11-08 MED ORDER — QUETIAPINE FUMARATE 25 MG PO TABS
12.5000 mg | ORAL_TABLET | Freq: Every day | ORAL | Status: DC
  Administered 2023-11-08 – 2023-11-11 (×4): 12.5 mg via ORAL
  Filled 2023-11-08 (×4): qty 1

## 2023-11-08 MED ORDER — LEVOTHYROXINE SODIUM 100 MCG/5ML IV SOLN
35.0000 ug | Freq: Every day | INTRAVENOUS | Status: DC
  Filled 2023-11-08: qty 5

## 2023-11-08 MED ORDER — MEMANTINE HCL 10 MG PO TABS
10.0000 mg | ORAL_TABLET | Freq: Two times a day (BID) | ORAL | Status: DC
  Administered 2023-11-08 – 2023-11-12 (×9): 10 mg via ORAL
  Filled 2023-11-08 (×6): qty 1
  Filled 2023-11-08: qty 2
  Filled 2023-11-08 (×2): qty 1

## 2023-11-08 MED ORDER — LEVOTHYROXINE SODIUM 50 MCG PO TABS
50.0000 ug | ORAL_TABLET | Freq: Every day | ORAL | Status: DC
  Administered 2023-11-08 – 2023-11-11 (×4): 50 ug via ORAL
  Filled 2023-11-08 (×4): qty 1

## 2023-11-08 MED ORDER — RISPERIDONE 0.5 MG PO TABS: 0.2500 mg | ORAL_TABLET | Freq: Every day | ORAL | Status: DC

## 2023-11-08 MED ORDER — ONDANSETRON HCL 4 MG PO TABS: 4.0000 mg | ORAL_TABLET | Freq: Four times a day (QID) | ORAL | Status: DC | PRN
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
## 2023-11-08 NOTE — ED Provider Notes (Addendum)
 I assumed care of this patient from previous provider.  Please see their note for further details of history, exam, and MDM.   Briefly patient is a 78 y.o. female who presented for seizure and fall resulting in head trauma.  Workup notable for small subarachnoid hemorrhage.  Case was discussed with neurosurgery who did not recommend any emergent intervention at this time.  Neurology consulted and recommended loading patient on meds which was performed.  Patient had another seizure requiring repeat CT scan which is currently pending.   CT scan shows resolving subarachnoid hemorrhage.  Patient was cleared for hospitalist admission.  I spoke with Dr. Sundil who will admit.      Trine Raynell Moder, MD 11/08/23 [phone]

## 2023-11-08 NOTE — Progress Notes (Addendum)
   Patient seen and examined at bedside, patient admitted after midnight, please see earlier detailed admission note by Micaela Speaker, MD. Briefly, patient presented secondary to a fall with evidence of multiple episodes of seizure-like activity. Vimpat  load given and patient started on Depakote . CT imaging revealed subarachnoid hemorrhage. Neurosurgery recommending no follow-up.  Subjective: Patient is altered. Per daughter-in-law, patient is far from baseline.  BP 118/73   Pulse 70   Temp 98.2 F (36.8 C) (Oral)   Resp 16   Ht 5' 6 (1.676 m)   Wt 53 kg   SpO2 99%   BMI 18.86 kg/m   General exam: Appears calm and comfortable  Respiratory system: Clear to auscultation. Respiratory effort normal. Cardiovascular system: S1 & S2 heard, RRR. No murmurs. Gastrointestinal system: Abdomen is nondistended, soft and nontender. Normal bowel sounds heard. Central nervous system: Alert and oriented to person. Musculoskeletal: No edema. No calf tenderness  Brief assessment/Plan:  Recurrent seizures Documentation mentions nonadherence with her outpatient AED regimen, however, daughter in law states she is not on Depakote  as an outpatient. Neurology consulted and recommended Vimpat  load with initiation of Depakote  -Continue Depakote  -Formal neurology consultation -Seizure precautions  Subarachnoid hemorrhage Neurosurgery consulted and recommended no follow-up. Repeat CT head significant for resolving hemorrhage. Recommendation for repeat stat CT head if recurrent seizure.  Dementia with behavioral disturbance Patient with history of agitation. Current presentation seems to be in line with delirium. -Delirium precautions  Fall Generalized anxiety disorder Hypothyroidism -Per H&P  Family communication: None at bedside DVT prophylaxis: SCDs Disposition: Home pending continued seizure workup and improvement of mental status toward baseline  Elgin Lam, MD [facility] 11/08/2023, 8:04 AM

## 2023-11-08 NOTE — Consult Note (Signed)
 NEUROLOGY CONSULT NOTE   Date of service: November 08, 2023 Patient Name: Emily Berger MRN:  [id number] DOB:  1945-10-02 Chief Complaint: Seizure Requesting Provider: Briana Elgin LABOR, MD  History of Present Illness  Emily Berger is a 79 y.o. female with hx of previous episode in January with concern for seizure who was started on Depakote  at that time.  She was not continued on Depakote  given some question about the etiology of the event that she had.  She was then doing well as an outpatient until about a week and a half ago at which point family noticed that she was more confused.  She was having some difficulty walking that she does not typically have.  Then prior to admission, she began having seizure which caused her to fall.  She is hitting her head against the ground repeatedly while seizing.  She was brought in and found to have a tiny sulcal subarachnoid hemorrhage.  She was admitted and had a repeat head CT today which shows stable tiny subarachnoid.   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    as a child - no surgery to correct this - Emily Berger   Heart murmur    slight   History of hiatal hernia    Hypothyroidism    PONV (postoperative nausea and vomiting)     Past Surgical History:  Procedure Laterality Date   ABDOMINAL HYSTERECTOMY     APPENDECTOMY     CARDIAC CATHETERIZATION     8x as a child   CHOLECYSTECTOMY     NASAL SEPTUM SURGERY     ORIF ANKLE FRACTURE Left 07/22/2018   Procedure: Open reduction internal fixation left ankle trimalleolar fracture/dislocation;  Surgeon: Emily Rush, MD;  Location: [facility] OR;  Service: Orthopedics;  Laterality: Left;  120 mins   TONSILLECTOMY     TOOTH EXTRACTION      Family History: History reviewed. No pertinent family history.  Social History  reports that she has never smoked. She has never used smokeless  tobacco. She reports that she does not drink alcohol and does not use drugs.  Allergies  Allergen Reactions   Latex Shortness Of Breath   Ambien [Zolpidem Tartrate]     Restlessness, not sleeping well, bad dreams   Latex Other (See Comments)    Patient states she has not had a reaction, but that she would like to err on the side of caution.   Percocet [Oxycodone -Acetaminophen ]     Halucinations, bad dreams    Medications   Current Facility-Administered Medications:    0.9 %  sodium chloride  infusion, 250 mL, Intravenous, PRN, Sundil, Subrina, MD   acetaminophen  (TYLENOL ) tablet 650 mg, 650 mg, Oral, Q6H PRN, Sundil, Subrina, MD   brexpiprazole  (REXULTI ) tablet 0.5 mg, 0.5 mg, Oral, Daily, Akintayo, Musa A, MD, 0.5 mg at 11/08/23 1408   donepezil  (ARICEPT ) tablet 10 mg, 10 mg, Oral, QHS, Sundil, Subrina, MD   ezetimibe  (ZETIA ) tablet 10 mg, 10 mg, Oral, QHS, Sundil, Subrina, MD   hydrOXYzine  (ATARAX ) tablet 25 mg, 25 mg, Oral, Q6H PRN, Sundil, Subrina, MD   lactated ringers  infusion, , Intravenous, Continuous, Sundil, Subrina, MD, Last Rate: 75 mL/hr at 11/08/23 0526, New Bag at 11/08/23 0526   levothyroxine  (SYNTHROID ) tablet 50 mcg, 50 mcg, Oral, QHS, Sundil, Subrina, MD   memantine  (NAMENDA ) tablet 10 mg, 10 mg, Oral, BID,  Sundil, Subrina, MD, 10 mg at 11/08/23 1144   OLANZapine  (ZYPREXA ) injection 1.5 mg, 1.5 mg, Intramuscular, Q12H PRN, Akintayo, Musa A, MD   ondansetron  (ZOFRAN ) tablet 4 mg, 4 mg, Oral, Q6H PRN **OR** ondansetron  (ZOFRAN ) injection 4 mg, 4 mg, Intravenous, Q6H PRN, Sundil, Subrina, MD   potassium chloride  SA (KLOR-CON  M) CR tablet 40 mEq, 40 mEq, Oral, Once, Briana Elgin LABOR, MD   QUEtiapine  (SEROQUEL ) tablet 12.5 mg, 12.5 mg, Oral, QHS, Akintayo, Musa A, MD   sodium chloride  flush (NS) 0.9 % injection 3 mL, 3 mL, Intravenous, Q12H, Sundil, Subrina, MD, 3 mL at 11/08/23 1000   sodium chloride  flush (NS) 0.9 % injection 3 mL, 3 mL, Intravenous, Q12H, Sundil, Subrina,  MD, 3 mL at 11/08/23 1000   sodium chloride  flush (NS) 0.9 % injection 3 mL, 3 mL, Intravenous, PRN, Sundil, Subrina, MD   valproate (DEPACON ) 500 mg in dextrose  5 % 50 mL IVPB, 500 mg, Intravenous, Q12H, Sundil, Subrina, MD, Stopped at 11/08/23 1250  Vitals   Vitals:   11/08/23 1315 11/08/23 1345 11/08/23 1414 11/08/23 1605  BP: 101/61 103/64  (!) 101/58  Pulse: 71 (!) 55  (!) 55  Resp:    18  Temp:   98.1 F (36.7 C) 98.4 F (36.9 C)  TempSrc:   Oral Oral  SpO2: 93% 97%  98%  Weight:    50.1 kg  Height:        Body mass index is 17.83 kg/m.   Physical Exam   Constitutional: Appears well-developed and well-nourished.  Neurologic Examination    Neuro: Mental Status: Patient is awake, alert, she is not oriented, she also appears to be slightly aphasic making word substitution errors and having some perseveration. Cranial Nerves: II: Visual Fields are full. Pupils are equal, round, and reactive to light.   III,IV, VI: EOMI without ptosis or diploplia.  V: Facial sensation is symmetric to temperature VII: Facial movement is symmetric.  VIII: hearing is intact to voice X: Uvula elevates symmetrically XII: tongue is midline without atrophy or fasciculations.  Motor: She has good strength which appears symmetric, but does not cooperate with full strength testing.  Sensory: Sensation is symmetric to light touch and temperature in the arms and legs. Cerebellar: She does not cooperate       Labs/Imaging/Neurodiagnostic studies   CBC:  Recent Labs  Lab 03-Dec-2023 1930 11/08/23 0531  WBC 6.5 8.7  NEUTROABS 4.9  --   HGB 12.4 11.6*  HCT 36.6 34.5*  MCV 84.1 83.7  PLT 253 207   Basic Metabolic Panel:  Lab Results  Component Value Date   NA 136 11/08/2023   K 3.4 (L) 11/08/2023   CO2 23 11/08/2023   GLUCOSE 116 (H) 11/08/2023   BUN 11 11/08/2023   CREATININE 0.69 11/08/2023   CALCIUM 8.4 (L) 11/08/2023   GFRNONAA >60 11/08/2023   GFRAA >60 07/20/2018    Lipid Panel: No results found for: LDLCALC HgbA1c: No results found for: HGBA1C Urine Drug Screen:     Component Value Date/Time   LABOPIA NONE DETECTED 10/24/2023 2226   COCAINSCRNUR NONE DETECTED 10/24/2023 2226   LABBENZ NONE DETECTED 10/24/2023 2226   AMPHETMU NONE DETECTED 10/24/2023 2226   THCU NONE DETECTED 10/24/2023 2226   LABBARB NONE DETECTED 10/24/2023 2226    Alcohol Level     Component Value Date/Time   [facility] <15 10/24/2023 2224    CT Head without contrast(Personally reviewed): negative  ASSESSMENT   KYNZI LEVAY is  a 78 y.o. female with 1.5 weeks of altered mental status with elevated WBC on UA, concerning for UTI.  I will also check an ammonia given that we are starting Depakote  and would not want to do so in setting of hyperammonemia.  Her exam seems concerning for possible aphasia, and given that that is focal I would favor getting an MRI.  I will also check an EEG.  In any case, she will need to be maintained on an antiepileptic moving forward.  RECOMMENDATIONS  Depakote  500 mg twice daily MRI brain EEG UA Neurology will follow up the above testing, but otherwise will be available as needed, please call with further questions or concerns ______________________________________________________________________    Signed, Aisha Seals, MD [facility]
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
## 2023-11-09 ENCOUNTER — Inpatient Hospital Stay (HOSPITAL_COMMUNITY)

## 2023-11-09 DIAGNOSIS — N390 Urinary tract infection, site not specified: Secondary | ICD-10-CM

## 2023-11-09 DIAGNOSIS — R569 Unspecified convulsions: Secondary | ICD-10-CM | POA: Diagnosis not present

## 2023-11-09 LAB — BASIC METABOLIC PANEL WITH GFR
Anion gap: 11 (ref 5–15)
BUN: 11 mg/dL (ref 8–23)
CO2: 23 mmol/L (ref 22–32)
Calcium: 8.3 mg/dL — ABNORMAL LOW (ref 8.9–10.3)
Chloride: 101 mmol/L (ref 98–111)
Creatinine, Ser: 0.65 mg/dL (ref 0.44–1.00)
GFR, Estimated: >60 mL/min (ref >60)
Glucose, Bld: 70 mg/dL (ref 70–99)
Potassium: 3.7 mmol/L (ref 3.5–5.1)
Sodium: 135 mmol/L (ref 135–145)

## 2023-11-09 LAB — CBC
HCT: 31.8 % — ABNORMAL LOW (ref 36.0–46.0)
Hemoglobin: 10.6 g/dL — ABNORMAL LOW (ref 12.0–15.0)
MCH: 28.4 pg (ref 26.0–34.0)
MCHC: 33.3 g/dL (ref 30.0–36.0)
MCV: 85.3 fL (ref 80.0–100.0)
Platelets: 199 K/uL (ref 150–400)
RBC: 3.73 MIL/uL — ABNORMAL LOW (ref 3.87–5.11)
RDW: 15.1 % (ref 11.5–15.5)
WBC: 5 K/uL (ref 4.0–10.5)
nRBC: 0 % (ref 0.0–0.2)

## 2023-11-09 MED ORDER — SODIUM CHLORIDE 0.9 % IV SOLN
1.0000 g | INTRAVENOUS | Status: AC
  Administered 2023-11-09 – 2023-11-11 (×3): 1 g via INTRAVENOUS
  Filled 2023-11-09 (×3): qty 10

## 2023-11-09 MED ORDER — DIVALPROEX SODIUM 250 MG PO DR TAB
500.0000 mg | DELAYED_RELEASE_TABLET | Freq: Two times a day (BID) | ORAL | Status: DC
  Administered 2023-11-09 – 2023-11-12 (×6): 500 mg via ORAL
  Filled 2023-11-09 (×6): qty 2

## 2023-11-09 NOTE — Evaluation (Signed)
 Occupational Therapy Evaluation Patient Details Name: Emily Berger MRN: [id number] DOB: 29-Mar-1946 Today's Date: 11/09/2023   History of Present Illness   Briefly patient is a 78 y.o. female who presented 11/07/23 for seizure and fall resulting in tiny sulcal subarachnoid hemorrhage.PMH: seizure,heart murmur, hypothy, ventricular septal defect, dementia.     Clinical Impressions Patient is currently requiring as high as minimal assistance with basic ADLs, as well as up to Contact guard assist with functional transfers and ambulation to bathroom with use of RW and cues for safety.   Current level of function is below patient's typical baseline.    During this evaluation, patient was limited by increased confusion in setting of dementia, generalized weakness, impaired activity tolerance, and h/o recent frequent falls, all of which has the potential to impact patient's and/or caregivers' safety and independence during functional mobility, as well as performance for ADLs.    Patient lives with family who  are able to provide 24/7 supervision and assistance.  Patient demonstrates good rehab potential, and should benefit from continued skilled occupational therapy services while in acute care to maximize safety, independence and quality of life at home.  Home health PT and OT are recommended.   ?      If plan is discharge home, recommend the following:   A little help with walking and/or transfers;A little help with bathing/dressing/bathroom;Assistance with cooking/housework;Supervision due to cognitive status     Functional Status Assessment   Patient has had a recent decline in their functional status and demonstrates the ability to make significant improvements in function in a reasonable and predictable amount of time.     Equipment Recommendations   None recommended by OT     Recommendations for Other Services         Precautions/Restrictions    Precautions Precautions: Fall Precaution/Restrictions Comments: sz Restrictions Weight Bearing Restrictions Per Provider Order: No     Mobility Bed Mobility               General bed mobility comments: seated in recliner    Transfers                          Balance Overall balance assessment: History of Falls, Needs assistance Sitting-balance support: No upper extremity supported, Feet supported Sitting balance-Leahy Scale: Good     Standing balance support: During functional activity, No upper extremity supported Standing balance-Leahy Scale: Poor                             ADL either performed or assessed with clinical judgement   ADL Overall ADL's : Needs assistance/impaired Eating/Feeding: Set up;Sitting   Grooming: Wash/dry hands;Set up;Sitting;Supervision/safety   Upper Body Bathing: Supervision/ safety;Set up;Sitting;Cueing for sequencing   Lower Body Bathing: Contact guard assist;Minimal assistance;Sitting/lateral leans;Sit to/from stand   Upper Body Dressing : Contact guard assist;Sitting;Cueing for sequencing   Lower Body Dressing: Supervision/safety Lower Body Dressing Details (indicate cue type and reason): Leaned down an adjusted sock. Otherwise unable to test as transportation arrived to assist pt to MRI and ADL assessmetn was truncated. Toilet Transfer: Contact guard assist;Rolling walker (2 wheels);Ambulation;Cueing for sequencing;Cueing for safety   Toileting- Clothing Manipulation and Hygiene: Contact guard assist;Minimal assistance;Cueing for sequencing;Cueing for safety;Sitting/lateral lean       Functional mobility during ADLs: Supervision/safety;Contact guard assist;Rolling walker (2 wheels);Cueing for sequencing;Cueing for safety       Vision  Vision Assessment?: No apparent visual deficits     Perception         Praxis         Pertinent Vitals/Pain Pain Assessment Pain Assessment: PAINAD Breathing:  normal Negative Vocalization: none Facial Expression: smiling or inexpressive Body Language: relaxed Consolability: no need to console PAINAD Score: 0 Pain Intervention(s): Monitored during session     Extremity/Trunk Assessment Upper Extremity Assessment Upper Extremity Assessment: Overall WFL for tasks assessed   Lower Extremity Assessment Lower Extremity Assessment: Overall WFL for tasks assessed RLE Deficits / Details: pt states her feet are numb,  slight decr in dorsiflex(H/O ankle fx) LLE Deficits / Details: decreased dorsiflex, reports numbness( H/O ankle fx)   Cervical / Trunk Assessment Cervical / Trunk Assessment: Normal   Communication Communication Communication: No apparent difficulties Factors Affecting Communication: Difficulty expressing self   Cognition Arousal: Alert Behavior During Therapy: Impulsive Cognition: History of cognitive impairments             OT - Cognition Comments: Alzheimer's dementia                 Following commands: Impaired Following commands impaired: Follows one step commands inconsistently     Cueing  General Comments   Cueing Techniques: Verbal cues      Exercises     Shoulder Instructions      Home Living Family/patient expects to be discharged to:: Private residence Living Arrangements: Other relatives Available Help at Discharge: Family;Available 24 hours/day;Personal care attendant Type of Home: House Home Access: Ramped entrance     Home Layout: One level     Bathroom Shower/Tub: Chief Strategy Officer: Handicapped height     Home Equipment: Tub bench;Grab bars - toilet   Additional Comments: PCA was to start today, 4 * -3 days a week      Prior Functioning/Environment Prior Level of Function : Needs assist  Cognitive Assist : Mobility (cognitive);ADLs (cognitive) Mobility (Cognitive): Intermittent cues ADLs (Cognitive): Intermittent cues       Mobility Comments: was  independnet ambulator until recent falls, now requires +1 ADLs Comments: assisted  by family with all IADLs PRN.  Working on an Aide starting to assist more with bathing.    OT Problem List: Decreased knowledge of use of DME or AE;Decreased cognition;Decreased safety awareness;Impaired balance (sitting and/or standing);Impaired sensation   OT Treatment/Interventions: Self-care/ADL training;Balance training;Therapeutic activities;DME and/or AE instruction;Cognitive remediation/compensation      OT Goals(Current goals can be found in the care plan section)   Acute Rehab OT Goals Patient Stated Goal: Go home OT Goal Formulation: With patient Time For Goal Achievement: 11/23/23 Potential to Achieve Goals: Good ADL Goals Pt Will Perform Grooming: with supervision;standing Pt Will Perform Lower Body Dressing: with supervision;sitting/lateral leans;sit to/from stand Pt Will Transfer to Toilet: ambulating;with supervision Pt Will Perform Toileting - Clothing Manipulation and hygiene: with supervision;sitting/lateral leans;sit to/from stand Additional ADL Goal #1: Pt will perform the standing Functional Reach t est and reach at least 7 to show improved dynamic standing balance needed for ADLs at home. Additional ADL Goal #2: Pt will perform the 30 second chair sstand test and score 10 reps with no more than supervision in order to demonstrate a decreased fall risk. Additional ADL Goal #3: Patient and family/Cgs will identify at least 3 fall prevention strategies to employ at home in order to maximize function and safety during ADLs and decrease caregiver burden while preventing possible injury and rehospitalization.   OT Frequency:  Min 2X/week    Co-evaluation PT/OT/SLP Co-Evaluation/Treatment: Yes Reason for Co-Treatment: For patient/therapist safety PT goals addressed during session: Mobility/safety with mobility OT goals addressed during session: ADL's and self-care      AM-PAC OT 6  Clicks Daily Activity     Outcome Measure Help from another person eating meals?: A Little Help from another person taking care of personal grooming?: A Little Help from another person toileting, which includes using toliet, bedpan, or urinal?: A Little Help from another person bathing (including washing, rinsing, drying)?: A Little Help from another person to put on and taking off regular upper body clothing?: A Little Help from another person to put on and taking off regular lower body clothing?: A Little 6 Click Score: 18   End of Session Equipment Utilized During Treatment: Gait belt;Rolling walker (2 wheels) Nurse Communication: Mobility status  Activity Tolerance: Patient tolerated treatment well Patient left:  (In WC with transportation to MRI)  OT Visit Diagnosis: Unsteadiness on feet (R26.81);Repeated falls (R29.6);Muscle weakness (generalized) (M62.81);Dizziness and giddiness (R42);Other symptoms and signs involving cognitive function                Time: 8878-8862 OT Time Calculation (min): 16 min Charges:  OT General Charges $OT Visit: 1 Visit OT Evaluation $OT Eval Low Complexity: 1 Low  Walther Sanagustin, OT Acute Rehab Services Office: [phone] 11/09/2023   Delon Falter 11/09/2023, 1:00 PM

## 2023-11-09 NOTE — Progress Notes (Signed)
 PROGRESS NOTE    Emily Berger  FMW:[id number] DOB: 08-30-1945 DOA: 11/07/2023 PCP: Joshua Harlene CROME, NP   Brief Narrative: Emily Berger is a 78 y.o. female with a history of alzheimer disease, GAD, hypothyroidism, possible seizure, vitamin D  deficiency.  Patient presented secondary to a fall and confusion with concern for seizure. Acute subarachnoid hemorrhage diagnosed on CT imaging with recommendation for no follow-up. Hospitalization complicated by recurrent seizure-like activity and confusion/delirium.   Assessment and Plan:  Recurrent seizures Documentation mentions nonadherence with her outpatient AED regimen, however, daughter in law states she is not on Depakote  as an outpatient. Neurology consulted and recommended Vimpat  load with initiation of Depakote . Neurology consulted. -Continue Depakote  -Neurology recommendations: Depakote , MRI, EEG -Seizure precautions  Possible delirium Concern for delirium vs aphasia. MRI brain ordered. Urine studies suggest UTI. -Continue Seroquel    Subarachnoid hemorrhage Neurosurgery consulted and recommended no follow-up. Repeat CT head significant for resolving hemorrhage. Recommendation for repeat stat CT head if recurrent seizure.   Dementia with behavioral disturbance Patient with history of agitation. Current presentation seems to be in line with delirium. -Delirium precautions -Continue Namenda  and Aricept  -Continue Seroquel   E. Coli UTI -Follow-up urine culture -Ceftriaxone  1 g IV daily   Fall -PT/OT eval  Generalized anxiety disorder -Continue Atarax   Hypothyroidism -Continue Synthroid  50 mcg   DVT prophylaxis: SCDs Code Status:   Code Status: Full Code Family Communication: Daughter-in-law at bedside Disposition Plan: Discharge pending ongoing specialist recommendations, improvement of mental status   Consultants:  Neurology Neurosurgery  Procedures:  None  Antimicrobials: Ceftriaxone  IV     Subjective: Patient without specific concerns at this time.  Objective: BP 116/67 (BP Location: Right Arm)   Pulse 71   Temp 98.2 F (36.8 C) (Oral)   Resp 16   Ht 5' 6 (1.676 m)   Wt 50.1 kg   SpO2 97%   BMI 17.83 kg/m   Examination:  General exam: Appears calm and comfortable Respiratory system: Clear to auscultation. Respiratory effort normal. Cardiovascular system: S1 & S2 heard, RRR. No murmurs, rubs, gallops or clicks. Gastrointestinal system: Abdomen is nondistended, soft and nontender. No organomegaly or masses felt. Normal bowel sounds heard. Central nervous system: Alert and oriented to person and place. No focal neurological deficits. Musculoskeletal: No edema. No calf tenderness Skin: No cyanosis. No rashes Psychiatry: Judgement and insight appear normal. Mood & affect appropriate.    Data Reviewed: I have personally reviewed following labs and imaging studies  CBC Lab Results  Component Value Date   WBC 5.0 11/09/2023   RBC 3.73 (L) 11/09/2023   HGB 10.6 (L) 11/09/2023   HCT 31.8 (L) 11/09/2023   MCV 85.3 11/09/2023   MCH 28.4 11/09/2023   PLT 199 11/09/2023   MCHC 33.3 11/09/2023   RDW 15.1 11/09/2023   LYMPHSABS 1.0 11/07/2023   MONOABS 0.5 11/07/2023   EOSABS 0.0 11/07/2023   BASOSABS 0.0 11/07/2023     Last metabolic panel Lab Results  Component Value Date   NA 135 11/09/2023   K 3.7 11/09/2023   CL 101 11/09/2023   CO2 23 11/09/2023   BUN 11 11/09/2023   CREATININE 0.65 11/09/2023   GLUCOSE 70 11/09/2023   GFRNONAA >60 11/09/2023   GFRAA >60 07/20/2018   CALCIUM 8.3 (L) 11/09/2023   PROT 5.8 (L) 11/08/2023   ALBUMIN 3.1 (L) 11/08/2023   BILITOT 1.0 11/08/2023   ALKPHOS 58 11/08/2023   AST 21 11/08/2023   ALT 20 11/08/2023   ANIONGAP  11 11/09/2023    GFR: Estimated Creatinine Clearance: 45.8 mL/min (by C-G formula based on SCr of 0.65 mg/dL).  Recent Results (from the past 240 hours)  Urine Culture     Status: Abnormal  (Preliminary result)   Collection Time: 11/08/23  2:28 AM   Specimen: In/Out Cath Urine  Result Value Ref Range Status   Specimen Description   Final    IN/OUT CATH URINE Performed at [facility], [address] [place], [place] [address]    Special Requests   Final    NONE Performed at [facility], [address], [place], [place] [address]    Culture (A)  Final    >=100,000 COLONIES/mL ESCHERICHIA COLI SUSCEPTIBILITIES TO FOLLOW Performed at [facility], [address], [place], [place] [address]    Report Status PENDING  Incomplete      Radiology Studies: MR BRAIN WO CONTRAST Result Date: 11/09/2023 CLINICAL DATA:  Mental status change, unknown cause EXAM: MRI HEAD WITHOUT CONTRAST TECHNIQUE: Multiplanar, multiecho pulse sequences of the brain and surrounding structures were obtained without intravenous contrast. COMPARISON:  CT of the head dated November 08, 2023 and MRI of the head dated May 15, 2023. FINDINGS: Brain: Age-related cerebral and cerebellar volume loss. Mild-to-moderate periventricular and subcortical white matter disease. No evidence of hemorrhage, mass, acute cortical infarct or hydrocephalus. The trace subarachnoid hemorrhage previously seen in the right frontal sulcus has resolved. There is no blooming artifact present on the susceptibility weighted imaging. Vascular: Normal arterial and venous flow voids. Skull and upper cervical spine: Intact and unremarkable. Sinuses/Orbits: Clear paranasal sinuses.  Normal orbits. Other: None. IMPRESSION: 1. Mild-to-moderate subcortical and periventricular white matter disease. 2. Interval resolution of subarachnoid hemorrhage. Electronically Signed   By: Evalene Coho M.D.   On: 11/09/2023 12:44   CT Head Wo Contrast Result Date: 11/08/2023 CLINICAL DATA:  repeat CT head, eval for any change on [facility] EXAM: CT HEAD WITHOUT CONTRAST TECHNIQUE: Contiguous axial images were obtained from  the base of the skull through the vertex without intravenous contrast. RADIATION DOSE REDUCTION: This exam was performed according to the departmental dose-optimization program which includes automated exposure control, adjustment of the mA and/or kV according to patient size and/or use of iterative reconstruction technique. COMPARISON:  CT head 11/07/2023 7:35 p.m. FINDINGS: Brain: No evidence of large-territorial acute infarction. No parenchymal hemorrhage. No mass lesion. Resolving right trace subarachnoid hemorrhage. No mass effect or midline shift. No hydrocephalus. Basilar cisterns are patent. Vascular: No hyperdense vessel. Skull: No acute fracture or focal lesion. Sinuses/Orbits: Paranasal sinuses and mastoid air cells are clear. The orbits are unremarkable. Other: None. IMPRESSION: 1. Resolving right trace subarachnoid hemorrhage. 2. No new acute intracranial abnormality. Electronically Signed   By: Morgane  Naveau M.D.   On: 11/08/2023 02:11   CT Head Wo Contrast Addendum Date: 11/07/2023 ADDENDUM REPORT: 11/07/2023 19:35 ADDENDUM: Small RIGHT frontal lobe subarachnoid hemorrhage. (NOT left as stated in findings and impression). These results were called by telephone at the time of interpretation on 11/07/2023 at 7:35 pm to provider [facility] , who verbally acknowledged these results. Electronically Signed   By: Morgane  Naveau M.D.   On: 11/07/2023 19:35   Result Date: 11/07/2023 CLINICAL DATA:  Witnessed fall onto left side. Got tripped up after walking down the hallway. Has a golf ball sized hematoma on left side of her head EXAM: CT HEAD WITHOUT CONTRAST CT CERVICAL SPINE WITHOUT CONTRAST TECHNIQUE: Multidetector CT imaging of the  head and cervical spine was performed following the standard protocol without intravenous contrast. Multiplanar CT image reconstructions of the cervical spine were also generated. RADIATION DOSE REDUCTION: This exam was performed according to the departmental  dose-optimization program which includes automated exposure control, adjustment of the mA and/or kV according to patient size and/or use of iterative reconstruction technique. COMPARISON:  CT head 10/24/2023 FINDINGS: CT HEAD FINDINGS Brain: Patchy and confluent areas of decreased attenuation are noted throughout the deep and periventricular white matter of the cerebral hemispheres bilaterally, compatible with chronic microvascular ischemic disease. No evidence of large-territorial acute infarction. No parenchymal hemorrhage. No mass lesion. Small left frontal lobe subarachnoid hemorrhage (3:20-24). No mass effect or midline shift. No hydrocephalus. Basilar cisterns are patent. Vascular: No hyperdense vessel. Atherosclerotic calcifications are present within the cavernous internal carotid and vertebral arteries. Skull: No acute fracture or focal lesion. Sinuses/Orbits: Paranasal sinuses and mastoid air cells are clear. The orbits are unremarkable. Other: Left frontal scalp 4 mm hematoma formation. CT CERVICAL SPINE FINDINGS Alignment: Reversal of normal cervical lordosis centered at the C4 through C6 levels. Skull base and vertebrae: Multilevel moderate degenerative changes spine most prominent at the C4-C6 levels. No acute fracture. No aggressive appearing focal osseous lesion or focal pathologic process. Soft tissues and spinal canal: No prevertebral fluid or swelling. No visible canal hematoma. Upper chest: Unremarkable. Other: Heterogeneous enlarged bilateral thyroid  glands. IMPRESSION: 1. Small left frontal lobe subarachnoid hemorrhage. 2. No acute displaced fracture or traumatic listhesis of the cervical spine. 3. Heterogeneous enlarged bilateral thyroid  glands. Recommend thyroid  ultrasound (ref: J Am Coll Radiol. 2015 Feb;12(2): 143-50). Electronically Signed: By: Morgane  Naveau M.D. On: 11/07/2023 19:28   CT Cervical Spine Wo Contrast Addendum Date: 11/07/2023 ADDENDUM REPORT: 11/07/2023 19:35 ADDENDUM:  Small RIGHT frontal lobe subarachnoid hemorrhage. (NOT left as stated in findings and impression). These results were called by telephone at the time of interpretation on 11/07/2023 at 7:35 pm to provider [facility] , who verbally acknowledged these results. Electronically Signed   By: Morgane  Naveau M.D.   On: 11/07/2023 19:35   Result Date: 11/07/2023 CLINICAL DATA:  Witnessed fall onto left side. Got tripped up after walking down the hallway. Has a golf ball sized hematoma on left side of her head EXAM: CT HEAD WITHOUT CONTRAST CT CERVICAL SPINE WITHOUT CONTRAST TECHNIQUE: Multidetector CT imaging of the head and cervical spine was performed following the standard protocol without intravenous contrast. Multiplanar CT image reconstructions of the cervical spine were also generated. RADIATION DOSE REDUCTION: This exam was performed according to the departmental dose-optimization program which includes automated exposure control, adjustment of the mA and/or kV according to patient size and/or use of iterative reconstruction technique. COMPARISON:  CT head 10/24/2023 FINDINGS: CT HEAD FINDINGS Brain: Patchy and confluent areas of decreased attenuation are noted throughout the deep and periventricular white matter of the cerebral hemispheres bilaterally, compatible with chronic microvascular ischemic disease. No evidence of large-territorial acute infarction. No parenchymal hemorrhage. No mass lesion. Small left frontal lobe subarachnoid hemorrhage (3:20-24). No mass effect or midline shift. No hydrocephalus. Basilar cisterns are patent. Vascular: No hyperdense vessel. Atherosclerotic calcifications are present within the cavernous internal carotid and vertebral arteries. Skull: No acute fracture or focal lesion. Sinuses/Orbits: Paranasal sinuses and mastoid air cells are clear. The orbits are unremarkable. Other: Left frontal scalp 4 mm hematoma formation. CT CERVICAL SPINE FINDINGS Alignment: Reversal of normal  cervical lordosis centered at the C4 through C6 levels. Skull base and vertebrae: Multilevel moderate degenerative  changes spine most prominent at the C4-C6 levels. No acute fracture. No aggressive appearing focal osseous lesion or focal pathologic process. Soft tissues and spinal canal: No prevertebral fluid or swelling. No visible canal hematoma. Upper chest: Unremarkable. Other: Heterogeneous enlarged bilateral thyroid  glands. IMPRESSION: 1. Small left frontal lobe subarachnoid hemorrhage. 2. No acute displaced fracture or traumatic listhesis of the cervical spine. 3. Heterogeneous enlarged bilateral thyroid  glands. Recommend thyroid  ultrasound (ref: J Am Coll Radiol. 2015 Feb;12(2): 143-50). Electronically Signed: By: Morgane  Naveau M.D. On: 11/07/2023 19:28      LOS: 1 day    Elgin Lam, MD [facility] 11/09/2023, 1:22 PM   If 7PM-7AM, please contact night-coverage [email or web address]

## 2023-11-09 NOTE — Hospital Course (Signed)
 Emily Berger is a 78 y.o. female with a history of alzheimer disease, GAD, hypothyroidism, possible seizure, vitamin D  deficiency.  Patient presented secondary to a fall and confusion with concern for seizure. Acute subarachnoid hemorrhage diagnosed on CT imaging with recommendation for no follow-up. Hospitalization complicated by recurrent seizure-like activity and confusion/delirium.

## 2023-11-09 NOTE — Consult Note (Addendum)
  Patient was not seen at the bedside during this encounter as she was actively participating in physical therapy and ambulating with staff. Family expressed concern regarding the patient's recent decline, noting that she had been functioning at her cognitive baseline until approximately one week ago. At baseline, the patient has a diagnosis of major neurocognitive disorder with behavioral disturbances and is known to be intermittently confused. She also recently experienced a fall at home. Family reports the current presentation is a different from her usual functioning.  Objective: Patient unavailable at bedside for direct assessment. Chart review reveals recent positive urinalysis consistent with a urinary tract infection, which had not been previously addressed in the plan of care. No acute changes noted in vital signs or imaging at this time. Behavioral health notes indicate fluctuating orientation and increased confusion over the past several days.  Assessment: Patient is a geriatric female with major neurocognitive disorder, recent fall, and acute behavioral changes. Given the positive urinalysis, the clinical picture is most consistent with delirium due to urinary tract infection. Delirium in this population can cause significant psychiatric and cognitive changes, and symptoms may persist even after treatment of the underlying medical condition begins.  Plan:  Recommend initiating treatment for urinary tract infection as likely contributor to acute mental status changes.  Monitor for improvement in cognition and behavior over the course of antibiotic treatment.  Anticipate psychiatric symptoms (e.g., confusion, agitation) may lag behind infection resolution--this is common in elderly patients with baseline neurocognitive disorders.  Once patient stabilizes, recommend returning to home dose of psychiatric medications.  Will attempt to reassess patient later today or tomorrow when  available.  No additional recommendations for changes in psychiatric medication until delirium resolves.  Please notify psychiatry if there are any acute safety concerns or changes in behavior in the interim.  No barriers to discharge once medically stable.

## 2023-11-09 NOTE — Evaluation (Signed)
 Physical Therapy Evaluation Patient Details Name: Emily Berger MRN: [id number] DOB: 1946-01-08 Today's Date: 11/09/2023  History of Present Illness  Briefly patient is a 78 y.o. female who presented 11/07/23 for seizure and fall resulting in tiny sulcal subarachnoid hemorrhage.PMH: seizure,heart murmur, hypothy, ventricular septal defect, dementia.  Clinical Impression  Pt admitted with above diagnosis.  Pt currently with functional limitations due to the deficits listed below (see PT Problem List). Pt will benefit from acute skilled PT to increase their independence and safety with mobility to allow discharge.     The patient received seated in recliner, daughter in law present and provides information about pt's recent history. Prior to falls, patient  was ambulatory with no device. Patient does have 24/7  supervision. Patient  was  to have PCA start today/4 * x 3 days per week.  Patient repeating that she cannot walk, needs 2 people, perseverates on the  knots on her head after a fall.  Patient did ambulate x 100' using a RW and CGA, cues for safety. Patient should progress to return home with 24/7  caregivers. Recommend HHPT.      If plan is discharge home, recommend the following: A little help with walking and/or transfers;A little help with bathing/dressing/bathroom;Assistance with cooking/housework;Assist for transportation;Help with stairs or ramp for entrance   Can travel by private vehicle   yes     Equipment Recommendations Rolling walker (2 wheels)  Recommendations for Other Services       Functional Status Assessment Patient has had a recent decline in their functional status and demonstrates the ability to make significant improvements in function in a reasonable and predictable amount of time.     Precautions / Restrictions Precautions Precautions: Fall Precaution/Restrictions Comments: sz Restrictions Weight Bearing Restrictions Per Provider Order: No       Mobility  Bed Mobility               General bed mobility comments: seated in recliner    Transfers Overall transfer level: Needs assistance Equipment used: Rolling walker (2 wheels) Transfers: Sit to/from Stand Sit to Stand: Contact guard assist           General transfer comment: multimodal cues for  safety, impulsive,    Ambulation/Gait Ambulation/Gait assistance: Contact guard assist Gait Distance (Feet): 100 Feet Assistive device: Rolling walker (2 wheels) Gait Pattern/deviations: Step-through pattern Gait velocity: decr     General Gait Details: multimodal cues for safe use of Rw, picked up the RW when turned around 180*  Stairs            Wheelchair Mobility     Tilt Bed    Modified Rankin (Stroke Patients Only)       Balance Overall balance assessment: History of Falls, Needs assistance Sitting-balance support: No upper extremity supported, Feet supported Sitting balance-Leahy Scale: Good     Standing balance support: During functional activity, No upper extremity supported Standing balance-Leahy Scale: Poor                               Pertinent Vitals/Pain Pain Assessment Pain Assessment: PAINAD Breathing: normal Negative Vocalization: none Facial Expression: smiling or inexpressive Body Language: relaxed Consolability: no need to console PAINAD Score: 0    Home Living Family/patient expects to be discharged to:: Private residence Living Arrangements: Other relatives Available Help at Discharge: Family;Available 24 hours/day;Personal care attendant Type of Home: House Home Access: Ramped entrance  Home Layout: One level Home Equipment: Tub bench;Grab bars - toilet Additional Comments: PCA was tto start today, 4 * -3 days a week    Prior Function Prior Level of Function : Needs assist  Cognitive Assist : Mobility (cognitive);ADLs (cognitive) Mobility (Cognitive): Intermittent cues          Mobility Comments: was independnet ambulator until recent falls, now requires +1 ADLs Comments: assisted  by family     Extremity/Trunk Assessment        Lower Extremity Assessment Lower Extremity Assessment: Overall WFL for tasks assessed;LLE deficits/detail;RLE deficits/detail RLE Deficits / Details: pt states her feet are numb,  slight decr in dorsiflex(H/O ankle fx) LLE Deficits / Details: decreased dorsiflex, reports numbness( H/O ankle fx)    Cervical / Trunk Assessment Cervical / Trunk Assessment: Normal  Communication   Communication Communication: No apparent difficulties    Cognition Arousal: Alert Behavior During Therapy: Impulsive   PT - Cognitive impairments: History of cognitive impairments, Memory, Orientation                       PT - Cognition Comments: oriented to hospital, had a fall and hit her head, stated july 4th was recent,  named dtr in Social worker as her sister in law, perseverates on her fall and has  knot on her head Following commands: Impaired Following commands impaired: Follows one step commands inconsistently     Cueing Cueing Techniques: Verbal cues     General Comments      Exercises     Assessment/Plan    PT Assessment Patient needs continued PT services  PT Problem List Decreased strength;Decreased mobility;Decreased safety awareness;Decreased range of motion;Decreased knowledge of precautions;Decreased activity tolerance;Decreased balance       PT Treatment Interventions DME instruction;Therapeutic activities;Gait training;Therapeutic exercise;Patient/family education;Functional mobility training    PT Goals (Current goals can be found in the Care Plan section)  Acute Rehab PT Goals Patient Stated Goal: walk better PT Goal Formulation: With patient/family Time For Goal Achievement: 11/23/23 Potential to Achieve Goals: Good    Frequency Min 3X/week     Co-evaluation PT/OT/SLP Co-Evaluation/Treatment: Yes Reason  for Co-Treatment: For patient/therapist safety PT goals addressed during session: Mobility/safety with mobility         AM-PAC PT 6 Clicks Mobility  Outcome Measure Help needed turning from your back to your side while in a flat bed without using bedrails?: A Little Help needed moving from lying on your back to sitting on the side of a flat bed without using bedrails?: A Little Help needed moving to and from a bed to a chair (including a wheelchair)?: A Little Help needed standing up from a chair using your arms (e.g., wheelchair or bedside chair)?: A Little Help needed to walk in hospital room?: A Little Help needed climbing 3-5 steps with a railing? : A Lot 6 Click Score: 17    End of Session Equipment Utilized During Treatment: Gait belt Activity Tolerance: Patient tolerated treatment well Patient left: in chair;with family/visitor present;with nursing/sitter in room Nurse Communication: Mobility status PT Visit Diagnosis: Unsteadiness on feet (R26.81);Muscle weakness (generalized) (M62.81);Repeated falls (R29.6)    Time: 8894-8861 PT Time Calculation (min) (ACUTE ONLY): 33 min   Charges:   PT Evaluation $PT Eval Low Complexity: 1 Low   PT General Charges $$ ACUTE PT VISIT: 1 Visit         Darice Potters PT Acute Rehabilitation Services Office [phone]   Potters Darice Norris 11/09/2023, 12:30  PM

## 2023-11-10 ENCOUNTER — Inpatient Hospital Stay (HOSPITAL_COMMUNITY)
Admit: 2023-11-10 | Discharge: 2023-11-10 | Disposition: A | Discharge status: Home / Self Care | Attending: Family Medicine | Admitting: Family Medicine

## 2023-11-10 DIAGNOSIS — N390 Urinary tract infection, site not specified: Secondary | ICD-10-CM | POA: Diagnosis not present

## 2023-11-10 DIAGNOSIS — R569 Unspecified convulsions: Secondary | ICD-10-CM | POA: Diagnosis not present

## 2023-11-10 LAB — URINE CULTURE: Culture: >=100000 — AB

## 2023-11-10 MED ORDER — ENSURE PLUS HIGH PROTEIN PO LIQD
237.0000 mL | Freq: Two times a day (BID) | ORAL | Status: DC
  Administered 2023-11-10 – 2023-11-12 (×5): 237 mL via ORAL

## 2023-11-10 MED ORDER — SODIUM CHLORIDE 0.9 % IV SOLN: INTRAVENOUS | Status: AC

## 2023-11-10 NOTE — Progress Notes (Addendum)
 PROGRESS NOTE    Emily Berger  FMW:[id number] DOB: 04/08/1946 DOA: 11/07/2023 PCP: Joshua Harlene CROME, NP   Brief Narrative: Emily Berger is a 78 y.o. female with a history of alzheimer disease, GAD, hypothyroidism, possible seizure, vitamin D  deficiency.  Patient presented secondary to a fall and confusion with concern for seizure. Acute subarachnoid hemorrhage diagnosed on CT imaging with recommendation for no follow-up. Hospitalization complicated by recurrent seizure-like activity and confusion/delirium.   Assessment and Plan:  Recurrent seizures Documentation mentions nonadherence with her outpatient AED regimen, however, daughter in law states she is not on Depakote  as an outpatient. Neurology consulted and recommended Vimpat  load with initiation of Depakote . Neurology consulted. MRI without acute process. -Continue Depakote  -Neurology recommendations: Depakote , EEG (pending) -Seizure precautions  Possible delirium Concern for delirium vs aphasia. MRI brain ordered and is without acute process. Urine studies suggest UTI. Symptoms have improved overnight and today, per daughter-in-law. Still not at recent baseline. -Continue Seroquel    Subarachnoid hemorrhage Neurosurgery consulted and recommended no follow-up. Repeat CT head significant for resolving hemorrhage. Recommendation for repeat stat CT head if recurrent seizure.   Dementia with behavioral disturbance Patient with history of agitation. Current presentation seems to be in line with delirium. Psychiatry consulted on admission. -Delirium precautions -Continue Namenda  and Aricept  -Continue Seroquel  -Psychiatry recommendations: Seroquel  (reduced dose), discontinue risperidone  and Zoloft , avoid benzodiazepines  E. Coli UTI -Follow-up urine culture -Ceftriaxone  1 g IV daily   Fall -PT/OT eval  Generalized anxiety disorder -Continue Atarax   Hypothyroidism -Continue Synthroid  50 mcg   DVT prophylaxis:  SCDs Code Status:   Code Status: Full Code Family Communication: Daughter-in-law at bedside Disposition Plan: Discharge pending ongoing specialist recommendations, improvement of mental status. Anticipate discharge home likely in 1-2 days.   Consultants:  Neurology Neurosurgery Psychiatry  Procedures:  EEG (pending)  Antimicrobials: Ceftriaxone  IV    Subjective: Tired. Otherwise, no concerns.  Objective: BP 115/65 (BP Location: Right Arm)   Pulse (!) 54   Temp 98.2 F (36.8 C)   Resp 20   Ht 5' 6 (1.676 m)   Wt 50.1 kg   SpO2 97%   BMI 17.83 kg/m   Examination:  General exam: Appears calm and comfortable Respiratory system: Clear to auscultation. Respiratory effort normal. Cardiovascular system: S1 & S2 heard, RRR. No murmurs, rubs, gallops or clicks. Gastrointestinal system: Abdomen is nondistended, soft and nontender. Normal bowel sounds heard. Central nervous system: Alert and oriented to person and place.   Data Reviewed: I have personally reviewed following labs and imaging studies  CBC Lab Results  Component Value Date   WBC 5.0 11/09/2023   RBC 3.73 (L) 11/09/2023   HGB 10.6 (L) 11/09/2023   HCT 31.8 (L) 11/09/2023   MCV 85.3 11/09/2023   MCH 28.4 11/09/2023   PLT 199 11/09/2023   MCHC 33.3 11/09/2023   RDW 15.1 11/09/2023   LYMPHSABS 1.0 11/07/2023   MONOABS 0.5 11/07/2023   EOSABS 0.0 11/07/2023   BASOSABS 0.0 11/07/2023     Last metabolic panel Lab Results  Component Value Date   NA 135 11/09/2023   K 3.7 11/09/2023   CL 101 11/09/2023   CO2 23 11/09/2023   BUN 11 11/09/2023   CREATININE 0.65 11/09/2023   GLUCOSE 70 11/09/2023   GFRNONAA >60 11/09/2023   GFRAA >60 07/20/2018   CALCIUM 8.3 (L) 11/09/2023   PROT 5.8 (L) 11/08/2023   ALBUMIN 3.1 (L) 11/08/2023   BILITOT 1.0 11/08/2023   ALKPHOS 58 11/08/2023  AST 21 11/08/2023   ALT 20 11/08/2023   ANIONGAP 11 11/09/2023    GFR: Estimated Creatinine Clearance: 45.8 mL/min  (by C-G formula based on SCr of 0.65 mg/dL).  Recent Results (from the past 240 hours)  Urine Culture     Status: Abnormal   Collection Time: 11/08/23  2:28 AM   Specimen: In/Out Cath Urine  Result Value Ref Range Status   Specimen Description   Final    IN/OUT CATH URINE Performed at [facility], [address], [place], [place] [address]    Special Requests   Final    NONE Performed at [facility], [address] [place], [place] [address]    Culture >=100,000 COLONIES/mL ESCHERICHIA COLI (A)  Final   Report Status 11/10/2023 FINAL  Final   Organism ID, Bacteria ESCHERICHIA COLI (A)  Final      Susceptibility   Escherichia coli - MIC*    AMPICILLIN >=32 RESISTANT Resistant     CEFAZOLIN  <=4 SENSITIVE Sensitive     CEFEPIME <=0.12 SENSITIVE Sensitive     CEFTRIAXONE  <=0.25 SENSITIVE Sensitive     CIPROFLOXACIN <=0.25 SENSITIVE Sensitive     GENTAMICIN >=16 RESISTANT Resistant     IMIPENEM <=0.25 SENSITIVE Sensitive     NITROFURANTOIN <=16 SENSITIVE Sensitive     TRIMETH/SULFA >=320 RESISTANT Resistant     AMPICILLIN/SULBACTAM 16 INTERMEDIATE Intermediate     PIP/TAZO <=4 SENSITIVE Sensitive ug/mL    * >=100,000 COLONIES/mL ESCHERICHIA COLI      Radiology Studies: MR BRAIN WO CONTRAST Result Date: 11/09/2023 CLINICAL DATA:  Mental status change, unknown cause EXAM: MRI HEAD WITHOUT CONTRAST TECHNIQUE: Multiplanar, multiecho pulse sequences of the brain and surrounding structures were obtained without intravenous contrast. COMPARISON:  CT of the head dated November 08, 2023 and MRI of the head dated May 15, 2023. FINDINGS: Brain: Age-related cerebral and cerebellar volume loss. Mild-to-moderate periventricular and subcortical white matter disease. No evidence of hemorrhage, mass, acute cortical infarct or hydrocephalus. The trace subarachnoid hemorrhage previously seen in the right frontal sulcus has resolved. There is no blooming artifact  present on the susceptibility weighted imaging. Vascular: Normal arterial and venous flow voids. Skull and upper cervical spine: Intact and unremarkable. Sinuses/Orbits: Clear paranasal sinuses.  Normal orbits. Other: None. IMPRESSION: 1. Mild-to-moderate subcortical and periventricular white matter disease. 2. Interval resolution of subarachnoid hemorrhage. Electronically Signed   By: Evalene Coho M.D.   On: 11/09/2023 12:44      LOS: 2 days    Elgin Lam, MD [facility] 11/10/2023, 10:41 AM   If 7PM-7AM, please contact night-coverage [email or web address]

## 2023-11-10 NOTE — Procedures (Signed)
 Patient Name: MAEGEN WIGLE  MRN: [id number]  Epilepsy Attending: Arlin MALVA Krebs  Referring Physician/Provider: Briana Elgin LABOR, MD  Date: 11/10/2023 Duration: 23.26 mins  Patient history: 78yo F with recurrent seizures. EEG to evaluate for seizure.  Level of alertness: Awake, asleep  AEDs during EEG study: VPA  Technical aspects: This EEG study was done with scalp electrodes positioned according to the 10-20 International system of electrode placement. Electrical activity was reviewed with band pass filter of 1-70Hz , sensitivity of 7 uV/mm, display speed of 68mm/sec with a 60Hz  notched filter applied as appropriate. EEG data were recorded continuously and digitally stored.  Video monitoring was available and reviewed as appropriate.  Description: The posterior dominant rhythm consists of 8 Hz activity of moderate voltage (25-35 uV) seen predominantly in posterior head regions, symmetric and reactive to eye opening and eye closing. Sleep was characterized by vertex waves, sleep spindles (12 to 14 Hz), maximal frontocentral region. Hyperventilation and photic stimulation were not performed.     IMPRESSION: This study is within normal limits. No seizures or epileptiform discharges were seen throughout the recording.  A normal interictal EEG does not exclude the diagnosis of epilepsy.   Kable Haywood O Meda Dudzinski

## 2023-11-10 NOTE — Progress Notes (Signed)
 EEG complete - results pending

## 2023-11-10 NOTE — Progress Notes (Signed)
 Emily Berger [facility] Liaison Note:  Notified by [facility] manager of patient/family request for [facility] services at home after discharge.   We are able to provide PT and OT services requested for this patient with discharge plan tentatively for tomorrow.   Please obtain orders for PT and OT services.   Thank you for the opportunity to participate in this patient's care.   Elouise Husband, BSN, RN, OCN [employer] [phone]

## 2023-11-10 NOTE — TOC Initial Note (Signed)
 Transition of Care [facility]) - Initial/Assessment Note   Patient Details  Name: Emily Berger MRN: [id number] Date of Birth: 05/28/1945  [facility] CM/SW Contact:    Emily GORMAN Aran, LCSW Phone Number: 11/10/2023, 3:28 PM  Clinical Narrative: PT/OT evaluations recommended Emily Berger services and a rolling walker. CSW spoke with daughter-in-Berger, Emily Berger, to discuss recommendations. DIL requested [facility] services as she has another family member using the service. DIL also requested [employer] provide the rolling walker rather than [employer]. CSW made HHPT/OT referral to [facility] with [facility], which was accepted. CSW made DME referral to [facility] with [employer]. Orders placed by hospitalist.  Expected Discharge Plan: Home w Home Health Services Barriers to Discharge: Continued Medical Work up  Patient Goals and CMS Choice Patient states their goals for this hospitalization and ongoing recovery are:: Get HH through [facility] CMS Medicare.gov Compare Post Acute Care list provided to:: Patient Represenative (must comment) Emily Berger (daughter-in-Berger)) Choice offered to / list presented to :  (Daughter-in-Berger)  Expected Discharge Plan and Services In-house Referral: Clinical Social Work Post Acute Care Choice: Horticulturist, commercial, Home Health Living arrangements for the past 2 months: Single Family Home          DME Arranged: Walker rolling DME Agency: [facility] Date DME Agency Contacted: 11/10/23 Time DME Agency Contacted: 1447 Representative spoke with at DME Agency: [facility] HH Arranged: PT, OT HH Agency: Other - See comment Freight forwarder)  Prior Living Arrangements/Services Living arrangements for the past 2 months: Single Family Home Lives with:: Adult Children Patient language and need for interpreter reviewed:: Yes Do you feel safe going back to the place where you live?: Yes      Need for Family Participation in Patient Care: No (Comment) Care giver support system in  place?: Yes (comment) Criminal Activity/Legal Involvement Pertinent to Current Situation/Hospitalization: No - Comment as needed  Activities of Daily Living ADL Screening (condition at time of admission) Independently performs ADLs?: No Does the patient have a NEW difficulty with bathing/dressing/toileting/self-feeding that is expected to last >3 days?: No Does the patient have a NEW difficulty with getting in/out of bed, walking, or climbing stairs that is expected to last >3 days?: No Does the patient have a NEW difficulty with communication that is expected to last >3 days?: No Is the patient deaf or have difficulty hearing?: No Does the patient have difficulty seeing, even when wearing glasses/contacts?: No Does the patient have difficulty concentrating, remembering, or making decisions?: No  Permission Sought/Granted Permission sought to share information with : Other (comment) Permission granted to share information with : Yes, Verbal Permission Granted Permission granted to share info w AGENCY: [facility], [facility]  Emotional Assessment Appearance:: Appears stated age Attitude/Demeanor/Rapport: Unable to Assess Affect (typically observed): Unable to Assess Orientation: : Oriented to Self, Oriented to Place Alcohol / Substance Use: Not Applicable Psych Involvement: Yes (comment)  Admission diagnosis:  Seizure (HCC) [R56.9] SAH (subarachnoid hemorrhage) (HCC) [I60.9] Seizure-like activity (HCC) [R56.9] Fall, initial encounter [W19.XXXA] Patient Active Problem List   Diagnosis Date Noted   Seizure (HCC) 11/08/2023   Subarachnoid hemorrhage (HCC) 11/08/2023   History of dementia 11/08/2023   Hypothyroidism 11/08/2023   Altered mental status 11/08/2023   MDD (major depressive disorder) 10/26/2023   Dementia with behavioral disturbance (HCC) 10/25/2023   Generalized anxiety disorder 10/25/2023   Trimalleolar fracture of ankle, closed, left, sequela 07/22/2018   PCP:  Joshua Harlene CROME, NP Pharmacy:   [id number] [place],  -  [address] AT [facility] [address] & [facility] [place] [address] [place] [address] Phone: [phone] Fax: [phone]  [id number] [place], [place] - [address] AT [facility] OF [facility] [address] & [facility] [address] [place] [address] Phone: [phone] Fax: [phone]  [facility] [address], [place] - [address] [place] [address] Phone: [phone] Fax: [phone]  Social Drivers of Health (SDOH) Social History: SDOH Screenings   Food Insecurity: No Food Insecurity (11/08/2023)  Housing: Low Risk  (11/08/2023)  Transportation Needs: No Transportation Needs (11/08/2023)  Utilities: Not At Risk (11/08/2023)  Social Connections: Socially Isolated (11/08/2023)  Tobacco Use: Low Risk  (11/08/2023)   SDOH Interventions:    Readmission Risk Interventions     No data to display

## 2023-11-10 NOTE — Plan of Care (Signed)
 MRI brain, EEG reports reviewed  MRI brain without contrast 1. Mild-to-moderate subcortical and periventricular white matter disease. 2. Interval resolution of subarachnoid hemorrhage.  Routine EEG This study is within normal limits. No seizures or epileptiform discharges were seen throughout the recording. A normal interictal EEG does not exclude the diagnosis of epilepsy.  Plan as previously documented by Dr. Michaela.  Inpatient neurology will sign off but please do not hesitate to reach out if additional questions or concerns arise

## 2023-11-10 NOTE — Consult Note (Addendum)
 [facility] Consult Follow-up  Patient Name: .Emily Berger  MRN: [id number]  DOB: 05-03-1946  Consult Order details:  Orders (From admission, onward)     Start     Ordered   11/08/23 0403  IP CONSULT TO PSYCHIATRY       Ordering Provider: Sundil, Subrina, MD  Provider:  (Not yet assigned)  Question Answer Comment  Location [facility]   Reason for Consult? Patient is on 4 different antipsychotic medication.  Need clearance and recommendation      11/08/23 0402             Mode of Visit: In person    Psychiatry Consult Evaluation  Service Date: November 10, 2023 LOS:  LOS: 2 days  Chief Complaint I don't know where I am.   Primary Psychiatric Diagnoses  Major Neurocognitive disorder with behavioral disturbance    Assessment  Emily Berger is a 78 y.o. female admitted: Medicallyfor 11/07/2023  6:14 PM for fall. She carries the psychiatric diagnoses of Anxiety and Depression and has a past medical history of of Alzheimer disease, seizures, hypothyroidism, and vitamin D  deficiency. Psychiatry consulted for Patient is on 4 different antipsychotic medication. Need clearance and recommendation.  Her current presentation of altered mental status characterized by confusion, disorientation, apathy, impaired cognition, disinhibition and agitation at baseline which got progressively worse after a fall is most consistent with Neurocognitive disorder with behavioral disturbance She does not meed criteria for psychiatric inpatient admission based on absent suicidal or homicidal ideation, intent or plan with good family support. Current outpatient psychotropic medications include  Seroquel  50 mg at bedtime, Risperidone  0.25 mg at bedtime, Sertraline  25 mg at bedtime, and Depakote  125 mg three times daily. Historically she has not experienced a robust response to these medications. She was compliant with medications prior to admission as evidenced by reports from  the daughter in law (who is her power of attorney). On initial examination, patient is awake and alert but disoriented to time, place, person, and situation. She is confused, tangential and unable to fully participate in meaningful conversation. Please see plan below for detailed recommendations.   Patient is a new consult with a medical history of Alzheimer's disease, generalized anxiety disorder, hypothyroidism, possible seizure disorder, and vitamin D  deficiency. Psychiatry was consulted to evaluate for delirium in the setting of a recent fall and concern for medication-related confusion, as the patient was prescribed four psychotropic medications on admission. Following medication adjustments, the patient was reassessed today. She appears to be returning to her cognitive and behavioral baseline. She was alert, pleasant, and oriented to person, place, and time. She appropriately answered most questions and demonstrated understanding of her current hospitalization and the reason for remaining inpatient at this time. Her affect was calm and appropriate, and she denied any hallucinations, delusions, or suicidal ideation.  There were no signs of acute psychiatric distress, agitation, or behavioral disturbances over the past 48 hours. She is noted to be eating and drinking well and was observed sleeping at noon, indicating restful and adequate sleep. Family was present at bedside and expressed that the patient has returned to her normal self. There is clear evidence of strong family support, and no current concerns for safety or mood instability were observed or reported.  Delirium is believed to have been multifactorial--likely due to a combination of medication effects and medical stressors following the fall--but has now resolved. Given the patient's improvement, return to baseline, and lack of acute psychiatric symptoms,  the psychiatry consult team will sign off at this time. Recommendations for simplified  medication regimen have been communicated to the primary team, and family has been educated on signs of recurrence. No further psychiatric follow-up is indicated during this admission.   Diagnoses:  Active Hospital problems: Principal Problem:   Seizure [facility]) Active Problems:   Generalized anxiety disorder   Subarachnoid hemorrhage (HCC)   History of dementia   Hypothyroidism   Altered mental status    Plan   ## Psychiatric Medication Recommendations:  --Consider Rexulti  0.5 mg daily for Depression, psychosis as well as agitation in patient with Alzheimer's.  --Continue Depakote  500 mg twice daily for seizure and mood stabilization. Depakote  level on 11/07/23 was 40. Please repeat level in 5 days ( 11/13/23) and check Ammonia level.  --Decreased Seroquel  to 12.5 mg at bedtime for sleep/agitation to prevent excessive sedation  --Consider Olanzapine  1.5 mg IM q 12 hr PRN for psychotic agitation.  --Discontinue Risperidone  and Sertraline  to avoid polypharmacy and EPS in patient with Alzheimer's.  --Please avoid Benzodiazepine in this patient due to risk of worsening cognition and disinhibition.  --Continue medical treatment as the primary team.     ## Medical Decision Making Capacity: Not specifically addressed in this encounter  ## Further Work-up:  --  TSH, B12, folate -- most recent EKG on 10/24/23 had QtC of 452 -- Pertinent labwork reviewed earlier this admission includes: K-3.4, HB-11.6.    ## Disposition:-- There are no psychiatric contraindications to discharge at this time  ## Behavioral / Environmental: -Delirium Precautions: Delirium Interventions for Nursing and Staff: - RN to open blinds every AM. - To Bedside: Glasses, hearing aide, and pt's own shoes. Make available to patients. when possible and encourage use. - Encourage po fluids when appropriate, keep fluids within reach. - OOB to chair with meals. - Passive ROM exercises to all extremities with AM & PM care. - RN  to assess orientation to person, time and place QAM and PRN. - Recommend extended visitation hours with familiar family/friends as feasible. - Staff to minimize disturbances at night. Turn off television when pt asleep or when not in use.    ## Safety and Observation Level:  - Based on my clinical evaluation, I estimate the patient to be at moderate risk of self harm in the current setting. - At this time, we recommend  1:1 Observation for safety due to fall risk. This decision is based on my review of the chart including patient's history and current presentation, interview of the patient, mental status examination, and consideration of suicide risk including evaluating suicidal ideation, plan, intent, suicidal or self-harm behaviors, risk factors, and protective factors. This judgment is based on our ability to directly address suicide risk, implement suicide prevention strategies, and develop a safety plan while the patient is in the clinical setting. Please contact our team if there is a concern that risk level has changed.  CSSR Risk Category:C-SSRS RISK CATEGORY: No Risk  Suicide Risk Assessment: Patient has following modifiable risk factors for suicide: pain, medical illness (ie new dx of cancer), which we are addressing by prescribing medications. Patient has following non-modifiable or demographic risk factors for suicide:  Patient has the following protective factors against suicide: Supportive family and no history of suicide attempts  Thank you for this consult request. Recommendations have been communicated to the primary team.  We will sign off at this time.   Emily GORMAN Ramp, FNP       History of  Present Illness  Relevant Aspects of Hospital ED Course:   Patient Report:  Patient voiced no concerns during today's encounter. She denied any adverse reactions or side effects related to her current medications. She was calm and cooperative, and her only request was for the  light to be turned off and the curtain pulled, suggesting a desire for rest and a low-stimulation environment. No family at bedside. EEG tech at bedisde.   ROS   Psychiatric and Social History  Psychiatric History:  Information collected from patient and daughter in law who is her power of attorney.   Prev Dx/Sx: Dementia, Depression, anxiety Current Psych Provider: NA Home Meds (current): See previous evaluation Previous Med Trials: See chart Therapy: NA   Prior Psych Hospitalization: NA  Prior Self Harm: NA Prior Violence: Reported   Family Psych History: NA Family Hx suicide: NA   Social History:  Developmental Hx: NA Educational Hx: NA Occupational Hx: NA Legal Hx: NA Living Situation: Lives with son and other relatives Spiritual Hx: Reports she was active in church Access to weapons/lethal means: NA    Substance History Alcohol: NA  Type of alcohol NA Last Drink NA Number of drinks per day NA History of alcohol withdrawal seizures NA History of DT's NA Tobacco: NA Illicit drugs: NA Prescription drug abuse: NA Rehab hx: NA.  Exam Findings  Physical Exam:  Vital Signs:  Temp:  [98.2 F (36.8 C)-98.3 F (36.8 C)] 98.2 F (36.8 C) (07/08 0945) Pulse Rate:  [54-78] 78 (07/08 0945) Resp:  [20] 20 (07/08 0408) BP: (110-117)/(65-70) 117/70 (07/08 0945) SpO2:  [97 %-98 %] 98 % (07/08 0945) Blood pressure 117/70, pulse 78, temperature 98.2 F (36.8 C), temperature source Oral, resp. rate 20, height 5' 6 (1.676 m), weight 50.1 kg, SpO2 98%. Body mass index is 17.83 kg/m.  Physical Exam Vitals and nursing note reviewed.  Constitutional:      Appearance: Normal appearance. She is normal weight.  Neurological:     General: No focal deficit present.     Mental Status: She is alert and oriented to person, place, and time. Mental status is at baseline.  Psychiatric:        Mood and Affect: Mood normal.        Behavior: Behavior normal.        Thought Content:  Thought content normal.        Judgment: Judgment normal.     Mental Status Exam: General Appearance: Casual  Orientation:  Full (Time, Place, and Person)  Memory:  Immediate;   Fair  Concentration:  Concentration: Fair and Attention Span: Fair  Recall:  Fair  Attention  Fair  Eye Contact:  Fair  Speech:  Clear and Coherent and Normal Rate  Language:  Fair  Volume:  Normal  Mood: (I don't know   Affect:  Appropriate and Congruent  Thought Process:  Coherent and Linear  Thought Content:  Logical  Suicidal Thoughts:  No  Homicidal Thoughts:  No  Judgement:  Intact  Insight:  Present  Psychomotor Activity:  Decreased  Akathisia:  No  Fund of Knowledge:  Fair      Assets:  Manufacturing systems engineer Social Support  Cognition:  Impaired,  Moderate  ADL's:  Impaired  AIMS (if indicated):        Other History   These have been pulled in through the EMR, reviewed, and updated if appropriate.  Family History:  The patient's family history is not on file.  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    as a child - no surgery to correct this - [facility]   Heart murmur    slight   History of hiatal hernia    Hypothyroidism    PONV (postoperative nausea and vomiting)     Surgical History: Past Surgical History:  Procedure Laterality Date   ABDOMINAL HYSTERECTOMY     APPENDECTOMY     CARDIAC CATHETERIZATION     8x as a child   CHOLECYSTECTOMY     NASAL SEPTUM SURGERY     ORIF ANKLE FRACTURE Left 07/22/2018   Procedure: Open reduction internal fixation left ankle trimalleolar fracture/dislocation;  Surgeon: Kit Rush, MD;  Location: [facility] OR;  Service: Orthopedics;  Laterality: Left;  120 mins   TONSILLECTOMY     TOOTH EXTRACTION       Medications:   Current Facility-Administered Medications:    acetaminophen  (TYLENOL ) tablet 650 mg, 650 mg, Oral, Q6H PRN,  Sundil, Subrina, MD   brexpiprazole  (REXULTI ) tablet 0.5 mg, 0.5 mg, Oral, Daily, Akintayo, Musa A, MD, 0.5 mg at 11/10/23 9046   cefTRIAXone  (ROCEPHIN ) 1 g in sodium chloride  0.9 % 100 mL IVPB, 1 g, Intravenous, Q24H, Briana Elgin LABOR, MD, Last Rate: 200 mL/hr at 11/09/23 1445, 1 g at 11/09/23 1445   divalproex  (DEPAKOTE ) DR tablet 500 mg, 500 mg, Oral, Q12H, Briana Elgin LABOR, MD, 500 mg at 11/10/23 9046   donepezil  (ARICEPT ) tablet 10 mg, 10 mg, Oral, QHS, Sundil, Subrina, MD, 10 mg at 11/09/23 2210   ezetimibe  (ZETIA ) tablet 10 mg, 10 mg, Oral, QHS, Sundil, Subrina, MD, 10 mg at 11/09/23 2211   hydrOXYzine  (ATARAX ) tablet 25 mg, 25 mg, Oral, Q6H PRN, Sundil, Subrina, MD   levothyroxine  (SYNTHROID ) tablet 50 mcg, 50 mcg, Oral, QHS, Sundil, Subrina, MD, 50 mcg at 11/09/23 2210   memantine  (NAMENDA ) tablet 10 mg, 10 mg, Oral, BID, Sundil, Subrina, MD, 10 mg at 11/10/23 9046   OLANZapine  (ZYPREXA ) injection 1.5 mg, 1.5 mg, Intramuscular, Q12H PRN, Akintayo, Musa A, MD   ondansetron  (ZOFRAN ) tablet 4 mg, 4 mg, Oral, Q6H PRN **OR** ondansetron  (ZOFRAN ) injection 4 mg, 4 mg, Intravenous, Q6H PRN, Sundil, Subrina, MD   QUEtiapine  (SEROQUEL ) tablet 12.5 mg, 12.5 mg, Oral, QHS, Akintayo, Musa A, MD, 12.5 mg at 11/09/23 2210   sodium chloride  flush (NS) 0.9 % injection 3 mL, 3 mL, Intravenous, Q12H, Sundil, Subrina, MD, 3 mL at 11/08/23 2311   sodium chloride  flush (NS) 0.9 % injection 3 mL, 3 mL, Intravenous, Q12H, Sundil, Subrina, MD, 3 mL at 11/10/23 0953   sodium chloride  flush (NS) 0.9 % injection 3 mL, 3 mL, Intravenous, PRN, Sundil, Subrina, MD  Allergies: Allergies  Allergen Reactions   Latex Shortness Of Breath   Ambien [Zolpidem Tartrate]     Restlessness, not sleeping well, bad dreams   Latex Other (See Comments)    Patient states she has not had a reaction, but that she would like to err on the side of caution.   Percocet [Oxycodone -Acetaminophen ]     Halucinations, bad dreams    Emily GORMAN Ramp, FNP

## 2023-11-11 DIAGNOSIS — N390 Urinary tract infection, site not specified: Secondary | ICD-10-CM | POA: Diagnosis not present

## 2023-11-11 NOTE — Progress Notes (Signed)
 Physical Therapy Treatment Patient Details Name: Emily Berger MRN: [id number] DOB: 1946-04-08 Today's Date: 11/11/2023   History of Present Illness Briefly patient is a 78 y.o. female who presented 11/07/23 for seizure and fall resulting in tiny sulcal subarachnoid hemorrhage.PMH: seizure,heart murmur, hypothy, ventricular septal defect, dementia.    PT Comments  AxO x 2 Hx Memory Loss pleasant and following all directions.  Slow moving. Fearful but very willing to walk. Assisted OOB to amb to bathroom then in hallway. General transfer comment: VC's on proper hand placement and walker use as well as safety with turns.  Assisted with a toilet transfer.  Pt was able to self don/dof underpants as well as self perform peri care while standing.  No LOB.  Does move slowly. General Gait Details: Tolerated amb to bathroom then in hallway a great distance.  Never used a walker before and Hx Alzheimers, Pt required assist with navigation around furniture and safety with turns. Pt is fearful of falling (again).  Moves slow. Suspect Furniture walker prior. Assisted back to bed with a warm blanket and positioned to comfort.   Pt plans to return home and LPT has rec [facility] PT and a walker.    If plan is discharge home, recommend the following: A little help with walking and/or transfers;A little help with bathing/dressing/bathroom;Assistance with cooking/housework;Assist for transportation;Help with stairs or ramp for entrance   Can travel by private vehicle        Equipment Recommendations  Rolling walker (2 wheels)    Recommendations for Other Services       Precautions / Restrictions Precautions Precautions: Fall Precaution/Restrictions Comments: Sz Restrictions Weight Bearing Restrictions Per Provider Order: No     Mobility  Bed Mobility Overal bed mobility: Modified Independent             General bed mobility comments: Pt self able to transition in and OOB with increased time.  Moves  slow.    Transfers Overall transfer level: Needs assistance Equipment used: Rolling walker (2 wheels), None Transfers: Sit to/from Stand Sit to Stand: Supervision, Contact guard assist           General transfer comment: VC's on proper hand placement and walker use as well as safety with turns.  Assisted with a toilet transfer.  Pt was able to self don/dof underpants as well as self perform peri care while standing.  No LOB.  Does move slowly.    Ambulation/Gait Ambulation/Gait assistance: Supervision, Contact guard assist Gait Distance (Feet): 225 Feet Assistive device: Rolling walker (2 wheels) Gait Pattern/deviations: Step-through pattern, Decreased stride length Gait velocity: decr     General Gait Details: Tolerated amb to bathroom then in hallway a great distance.  Never used a walker before and Hx Alzheimers, Pt required assist with navigation around furniture and safety with turns. Pt is fearful of falling (again).  Moves slow. Suspect Furniture walker prior.   Stairs             Wheelchair Mobility     Tilt Bed    Modified Rankin (Stroke Patients Only)       Balance                                            Communication Communication Communication: No apparent difficulties  Cognition   Behavior During Therapy: WFL for tasks assessed/performed   PT - Cognitive  impairments: History of cognitive impairments, Memory, Orientation                       PT - Cognition Comments: AxO x 2 Hx Memory Loss pleasant and following all directions.  Slow moving. Fearful. Following commands: Intact Following commands impaired: Follows one step commands inconsistently    Cueing Cueing Techniques: Verbal cues  Exercises      General Comments        Pertinent Vitals/Pain Pain Assessment Pain Assessment: No/denies pain    Home Living                          Prior Function            PT Goals (current goals  can now be found in the care plan section) Progress towards PT goals: Progressing toward goals    Frequency    Min 3X/week      PT Plan      Co-evaluation              AM-PAC PT 6 Clicks Mobility   Outcome Measure  Help needed turning from your back to your side while in a flat bed without using bedrails?: None Help needed moving from lying on your back to sitting on the side of a flat bed without using bedrails?: None Help needed moving to and from a bed to a chair (including a wheelchair)?: None Help needed standing up from a chair using your arms (e.g., wheelchair or bedside chair)?: None Help needed to walk in hospital room?: A Little Help needed climbing 3-5 steps with a railing? : A Little 6 Click Score: 22    End of Session Equipment Utilized During Treatment: Gait belt Activity Tolerance: Patient tolerated treatment well Patient left: in bed;with call bell/phone within reach;with family/visitor present Nurse Communication: Mobility status PT Visit Diagnosis: Unsteadiness on feet (R26.81);Muscle weakness (generalized) (M62.81);Repeated falls (R29.6)     Time: 8379-8353 PT Time Calculation (min) (ACUTE ONLY): 26 min  Charges:    $Gait Training: 8-22 mins $Therapeutic Activity: 8-22 mins PT General Charges $$ ACUTE PT VISIT: 1 Visit                     Katheryn Leap  PTA Acute  Rehabilitation Services Office M-F          [phone]

## 2023-11-11 NOTE — Progress Notes (Signed)
 PROGRESS NOTE  Emily Berger  DOB: 1945-05-31  PCP: Joshua Harlene CROME, NP FMW:[id number]  DOA: 11/07/2023  LOS: 3 days  Hospital Day: 5  Brief narrative: ABBRIELLE Berger is a 78 y.o. female with PMH significant for Alzheimer's disease, GAD, hypothyroidism, possible seizure. Patient lives at home, recently started on Depakote  for seizure-like activity, subsequently the dose was increased as well. 7/5, EMS was called for a witnessed fall.  Per report, patient lost her balance while walking, fell onto his left side and sustained a hematoma on the head.  Per report, he also had progressively worsening lethargy, weakness, altered mental status, poor oral intake for previous 3 days.  CT head showed acute subarachnoid hemorrhage. Admitted to [facility].  Repeat CT head showed resolution of the trace subarachnoid hemorrhage. While in the ED, patient had an episode of seizure.  Neurology was consulted, loaded with Vimpat , Depakote . Also, urinalysis was positive for nitrite  Admitted to [facility]. Neurology, psychiatry were consulted Subsequent urine culture positive for E. Coli  Hospitalization was complicated by recurrent seizures  Subjective: Patient was seen and examined this morning. Sitting up in recliner.  Family member was at bedside. Patient had a good night, slept well. Not restless or agitated.  Oriented to self and place only. Chart reviewed. In the last 24 hours, afebrile, heart rate in 50s, blood pressure in the low normal range, breathing on room air. Most recent labs from 7/7 normal.  Showed no significant changes.  I had a long conversation with patient's daughter-in-law [name] this afternoon.  She mentions that her first episode of UTI in June that was treated with Keflex  probably did not resolve and hence she has UTI again.  Patient is currently on IV Rocephin , D3 today.  She wants to recheck urinalysis to document clearance.  I mentioned to her that in absence of fever, WBC count and  any local UTI symptoms, I do not think that was necessary.  However given her persistent concern, I ordered for urinalysis to be checked again.  Assessment and plan: Recurrent seizures Seen by neurology EEG unremarkable Currently on Depakote  Seizure precautions  Subarachnoid hemorrhage Initial CT scan suggestive of trace subarachnoid hemorrhage.  Further CT head and MRI 7/8 showed resolution.  Acute delirium  progressive dementia with behavioral disturbance Per history, patient had gradually progressive behavioral changes as an outpatient.   Imaging showed mild-to-moderate subcortical and periventricular white matter disease. Hospitalization complicated by further alteration in mental status likely complicated by UTI. Seen by neurology and psychiatry Per psychiatry recommendation, discontinued risperidone  and Zoloft , to avoid benzodiazepine Currently on Rexulti  0.5 mg-1 mg daily, Seroquel  12.5 mg nightly, Aricept  10 mg nightly, Namenda  10 mg twice daily, Atarax  olanzapine  as needed  E. Coli UTI Urinalysis on admission positive for nitrite.  Urine culture grew E. coli.   Currently on ceftriaxone  1 g IV daily Repeat urinalysis ordered for family's request, see above  Hypothyroidism Synthroid  50 mcg daily TSH obtain No results for input(s): TSH in the last 8760 hours.  HLD Zetia    Generalized weakness  fall PT/OT eval obtained.  Home with PT recommended    Goals of care   Code Status: Full Code     DVT prophylaxis:  SCDs Start: 11/08/23 0303 Place TED hose Start: 11/08/23 0303   Antimicrobials: IV Rocephin  Fluid: None Consultants: None Family Communication: Daughter-in-law on the phone  Status: Inpatient Level of care:  Progressive   Patient is from: Home Needs to continue in-hospital care: Remains altered  Anticipated d/c to: Pending clinical course    Diet:  Diet Order             Diet heart healthy/carb modified Room service appropriate? Yes; Fluid  consistency: Thin  Diet effective now                   Scheduled Meds:  brexpiprazole   0.5 mg Oral Daily   divalproex   500 mg Oral Q12H   donepezil   10 mg Oral QHS   ezetimibe   10 mg Oral QHS   feeding supplement  237 mL Oral BID BM   levothyroxine   50 mcg Oral QHS   memantine   10 mg Oral BID   QUEtiapine   12.5 mg Oral QHS   sodium chloride  flush  3 mL Intravenous Q12H   sodium chloride  flush  3 mL Intravenous Q12H    PRN meds: acetaminophen , hydrOXYzine , OLANZapine , ondansetron  **OR** ondansetron  (ZOFRAN ) IV, sodium chloride  flush   Infusions:   sodium chloride  40 mL/hr at 11/10/23 2150   cefTRIAXone  (ROCEPHIN )  IV 1 g (11/10/23 1451)    Antimicrobials: Anti-infectives (From admission, onward)    Start     Dose/Rate Route Frequency Ordered Stop   11/09/23 1415  cefTRIAXone  (ROCEPHIN ) 1 g in sodium chloride  0.9 % 100 mL IVPB        1 g 200 mL/hr over 30 Minutes Intravenous Every 24 hours 11/09/23 1322 11/12/23 1414       Objective: Vitals:   11/10/23 2022 11/11/23 0416  BP: (!) 99/48 (!) 104/58  Pulse:  (!) 56  Resp:    Temp: 97.7 F (36.5 C) 98.3 F (36.8 C)  SpO2: 98% 96%    Intake/Output Summary (Last 24 hours) at 11/11/2023 1515 Last data filed at 11/11/2023 0546 Gross per 24 hour  Intake 436.61 ml  Output --  Net 436.61 ml   Filed Weights   11/07/23 1842 11/08/23 1605  Weight: 53 kg 50.1 kg   Weight change:  Body mass index is 17.83 kg/m.   Physical Exam: General exam: Pleasant, elderly Caucasian female Skin: No rashes, lesions or ulcers. HEENT: Atraumatic, normocephalic, no obvious bleeding Lungs: Clear to auscultation bilaterally,  CVS: S1, S2, no murmur,   GI/Abd: Soft, nontender, nondistended, bowel sound present,   CNS: Alert, awake, oriented to self and place only.  Not restless or agitated Psychiatry: Sad affect Extremities: No pedal edema, no calf tenderness,   Data Review: I have personally reviewed the laboratory data and  studies available.  F/u labs ordered Unresulted Labs (From admission, onward)     Start     Ordered   11/12/23 0500  TSH  Tomorrow morning,   R       Question:  Specimen collection method  Answer:  Lab=Lab collect   11/11/23 0936   11/12/23 0500  Urinalysis, Routine w reflex microscopic -Urine, Clean Catch  Tomorrow morning,   R       Question:  Specimen Source  Answer:  Urine, Clean Catch   11/11/23 1509            Signed, Chapman Rota, MD [facility] 11/11/2023

## 2023-11-12 ENCOUNTER — Other Ambulatory Visit (HOSPITAL_COMMUNITY): Payer: Self-pay

## 2023-11-12 DIAGNOSIS — R569 Unspecified convulsions: Secondary | ICD-10-CM | POA: Diagnosis not present

## 2023-11-12 LAB — URINALYSIS, ROUTINE W REFLEX MICROSCOPIC
Bacteria, UA: NONE SEEN
Bilirubin Urine: NEGATIVE
Glucose, UA: NEGATIVE mg/dL
Hgb urine dipstick: NEGATIVE
Ketones, ur: 5 mg/dL — AB
Nitrite: NEGATIVE
Protein, ur: NEGATIVE mg/dL
Specific Gravity, Urine: 1.016 (ref 1.005–1.030)
pH: 5 (ref 5.0–8.0)

## 2023-11-12 LAB — TSH: TSH: 1.158 u[IU]/mL (ref 0.350–4.500)

## 2023-11-12 LAB — GLUCOSE, CAPILLARY: Glucose-Capillary: 84 mg/dL (ref 70–99)

## 2023-11-12 MED ORDER — QUETIAPINE FUMARATE 25 MG PO TABS
12.5000 mg | ORAL_TABLET | Freq: Every day | ORAL | 0 refills | Status: DC
  Filled 2023-11-12: qty 15, 30d supply, fill #0

## 2023-11-12 MED ORDER — DIVALPROEX SODIUM 500 MG PO DR TAB
500.0000 mg | DELAYED_RELEASE_TABLET | Freq: Two times a day (BID) | ORAL | 0 refills | Status: AC
  Filled 2023-11-12: qty 49, 25d supply, fill #0

## 2023-11-12 MED ORDER — CEFADROXIL 500 MG PO CAPS
1000.0000 mg | ORAL_CAPSULE | Freq: Two times a day (BID) | ORAL | 0 refills | Status: AC
  Filled 2023-11-12: qty 28, 7d supply, fill #0

## 2023-11-12 MED ORDER — BREXPIPRAZOLE 0.5 MG PO TABS
0.5000 mg | ORAL_TABLET | Freq: Every day | ORAL | 0 refills | Status: DC
  Filled 2023-11-12: qty 30, 30d supply, fill #0

## 2023-11-12 MED ORDER — FLORANEX PO PACK
1.0000 g | PACK | Freq: Three times a day (TID) | ORAL | 0 refills | Status: AC
Start: 2023-11-12 — End: 2023-11-19
  Filled 2023-11-12: qty 24, 8d supply, fill #0

## 2023-11-12 MED ORDER — ENSURE PLUS HIGH PROTEIN PO LIQD: 237.0000 mL | Freq: Two times a day (BID) | ORAL | Status: DC

## 2023-11-12 NOTE — Discharge Summary (Addendum)
 Physician Discharge Summary  Emily Berger FMW:[id number] DOB: 11-08-1945 DOA: 11/07/2023  PCP: Joshua Harlene CROME, NP  Admit date: 11/07/2023 Discharge date: 11/12/2023  Admitted From: Home Discharge disposition: Home with home health  Recommendations at discharge:  Complete the course of antibiotics with 7 more days of oral cefadroxil  and probiotics Ensure compliance with neuropsych medications as adjusted by psychiatry   Brief narrative: Emily Berger is a 78 y.o. female with PMH significant for Alzheimer's disease, GAD, hypothyroidism, possible seizure. Patient lives at home, recently started on Depakote  for seizure-like activity, subsequently the dose was increased as well. 7/5, EMS was called for a witnessed fall.  Per report, patient lost her balance while walking, fell onto his left side and sustained a hematoma on the head.  Per report, he also had progressively worsening lethargy, weakness, altered mental status, poor oral intake for previous 3 days.  CT head showed acute subarachnoid hemorrhage. Admitted to [facility].  Repeat CT head showed resolution of the trace subarachnoid hemorrhage. While in the ED, patient had an episode of seizure.  Neurology was consulted, loaded with Vimpat , Depakote . Also, urinalysis was positive for nitrite  Admitted to [facility]. Neurology, psychiatry were consulted Subsequent urine culture positive for E. Coli. Hospitalization was complicated by recurrent seizures  Subjective: Patient was seen and examined this morning. Sitting up in recliner.  Not in distress. Daughter in Occupational hygienist at bedside. Slept well last night except to use bathroom. Remains pleasantly confused.  Not restless or agitated. Urinalysis from this morning shows significant improvement  Hospital course: Recurrent seizures Seen by neurology EEG unremarkable Currently on Depakote  500 mg BID. Seizure precautions  Subarachnoid hemorrhage Initial CT scan suggestive of trace  subarachnoid hemorrhage.  Further CT head and MRI 7/8 showed resolution.  Acute delirium  progressive dementia with behavioral disturbance Per history, patient had gradually progressive behavioral changes as an outpatient.   Imaging showed mild-to-moderate subcortical and periventricular white matter disease. Hospitalization complicated by further alteration in mental status likely complicated by UTI. Seen by neurology and psychiatry Per psychiatry recommendation, discontinued risperidone  and Zoloft , to avoid benzodiazepine Currently on Rexulti  0.5 mg-1 mg daily, Seroquel  12.5 mg nightly, Aricept  10 mg nightly, Namenda  10 mg twice daily, Atarax  as needed. Prescriptions given as recommended by psychiatry. However per pharmacy Rexulti  is expensive and is not affordable by family. Psychiatry recommended to f/u with outpatient psychiatry. For now, continue other meds as above at discharge.   E. Coli UTI Urinalysis on admission positive for nitrite.  Urine culture grew E. coli.   Currently on ceftriaxone  1 g IV daily Repeat urinalysis this morning shows significant improvement, only trace leukocytes seen. Because of the recent recurrence of UTI, will treat this time for a total of 10 days of antibiotics.  Complete the course with 7 more days of oral cefadroxil  with probiotics.  Hypothyroidism Synthroid  50 mcg daily Recent Labs    11/12/23 0345  TSH 1.158   HLD Zetia    Generalized weakness  fall PT/OT eval obtained.  Home with PT recommended    Goals of care   Code Status: Full Code   Family Communication: Daughter-in-law at bedside  Diet:  Diet Order             Diet general           Diet heart healthy/carb modified Room service appropriate? Yes; Fluid consistency: Thin  Diet effective now  Nutritional status:  Body mass index is 17.83 kg/m.       Wounds:  - Wound 11/08/23 1600 Pressure Injury Coccyx Medial;Bilateral Stage 1 -  Intact skin  with non-blanchable redness of a localized area usually over a bony prominence. (Active)  Date First Assessed/Time First Assessed: 11/08/23 1600   Primary Wound Type: Pressure Injury  Location: Coccyx  Location Orientation: Medial;Bilateral  Staging: Stage 1 -  Intact skin with non-blanchable redness of a localized area usually over a bony...    Assessments 11/08/2023  4:05 PM 11/12/2023  9:00 AM  Wound Image     Site / Wound Assessment Clean;Dry;Pink;Red Clean;Dry;Red  Peri-wound Assessment Erythema (blanchable);Erythema (non-blanchable) Intact;Erythema (blanchable)  Treatment -- Cleansed;Off loading  Dressing Type Foam - Lift dressing to assess site every shift Foam - Lift dressing to assess site every shift  Dressing Changed New --  Dressing Status -- Clean, Dry, Intact     No associated orders.    Discharge Exam:   Vitals:   11/11/23 0416 11/11/23 1937 11/12/23 0459 11/12/23 1159  BP: (!) 104/58 (!) 94/57 107/67 (!) 104/49  Pulse: (!) 56 62 90 (!) 56  Resp:   17 18  Temp: 98.3 F (36.8 C) 98.2 F (36.8 C) 98.4 F (36.9 C) 98.2 F (36.8 C)  TempSrc: Oral Oral Oral Oral  SpO2: 96% 97% 95% 98%  Weight:      Height:        Body mass index is 17.83 kg/m.   General exam: Pleasant, elderly Caucasian female Skin: No rashes, lesions or ulcers. HEENT: Atraumatic, normocephalic, no obvious bleeding Lungs: Clear to auscultation bilaterally,  CVS: S1, S2, no murmur,   GI/Abd: Soft, nontender, nondistended, bowel sound present,   CNS: Alert, awake, oriented to self and place only.  Not restless or agitated Psychiatry: Sad affect Extremities: No pedal edema, no calf tenderness,   Follow ups:    Follow-up Information     [facility] Follow up.   Why: [facility] will provide PT and OT in the home after discharge. Contact information: [address] [place] [address] [phone]         Joshua Harlene CROME, NP Follow up.   Specialty: Neurology Contact  information: [address] [place] [address] [phone]                 Discharge Instructions:   Discharge Instructions     Call MD for:  difficulty breathing, headache or visual disturbances   Complete by: As directed    Call MD for:  extreme fatigue   Complete by: As directed    Call MD for:  hives   Complete by: As directed    Call MD for:  persistant dizziness or light-headedness   Complete by: As directed    Call MD for:  persistant nausea and vomiting   Complete by: As directed    Call MD for:  severe uncontrolled pain   Complete by: As directed    Call MD for:  temperature >100.4   Complete by: As directed    Diet general   Complete by: As directed    Discharge instructions   Complete by: As directed    Recommendations at discharge:   Complete the course of antibiotics with 7 more days of oral cefadroxil  and probiotics  Ensure compliance with neuropsych medications as adjusted by psychiatry  General discharge instructions: Follow with Primary MD Joshua Harlene CROME, NP in 7 days  Please request your  PCP  to go over your hospital tests, procedures, radiology results at the follow up. Please get your medicines reviewed and adjusted.  Your PCP may decide to repeat certain labs or tests as needed. Do not drive, operate heavy machinery, perform activities at heights, swimming or participation in water activities or provide baby sitting services if your were admitted for syncope or siezures until you have seen by Primary MD or a Neurologist and advised to do so again. [place]  Controlled Substance Reporting System database was reviewed. Do not drive, operate heavy machinery, perform activities at heights, swim, participate in water activities or provide baby-sitting services while on medications for pain, sleep and mood until your outpatient physician has reevaluated you and advised to do so again.  You are strongly recommended to comply with  the dose, frequency and duration of prescribed medications. Activity: As tolerated with Full fall precautions use walker/cane & assistance as needed Avoid using any recreational substances like cigarette, tobacco, alcohol, or non-prescribed drug. If you experience worsening of your admission symptoms, develop shortness of breath, life threatening emergency, suicidal or homicidal thoughts you must seek medical attention immediately by calling 911 or calling your MD immediately  if symptoms less severe. You must read complete instructions/literature along with all the possible adverse reactions/side effects for all the medicines you take and that have been prescribed to you. Take any new medicine only after you have completely understood and accepted all the possible adverse reactions/side effects.  Wear Seat belts while driving. You were cared for by a hospitalist during your hospital stay. If you have any questions about your discharge medications or the care you received while you were in the hospital after you are discharged, you can call the unit and ask to speak with the hospitalist or the covering physician. Once you are discharged, your primary care physician will handle any further medical issues. Please note that NO REFILLS for any discharge medications will be authorized once you are discharged, as it is imperative that you return to your primary care physician (or establish a relationship with a primary care physician if you do not have one).   Discharge wound care:   Complete by: As directed    Increase activity slowly   Complete by: As directed        Discharge Medications:   Allergies as of 11/12/2023       Reactions   Latex Shortness Of Breath   Ambien [zolpidem Tartrate]    Restlessness, not sleeping well, bad dreams   Latex Other (See Comments)   Patient states she has not had a reaction, but that she would like to err on the side of caution.   Percocet  [oxycodone -acetaminophen ]    Halucinations, bad dreams        Medication List     STOP taking these medications    risperiDONE  0.25 MG tablet Commonly known as: RISPERDAL    sertraline  25 MG tablet Commonly known as: ZOLOFT        TAKE these medications    cefadroxil  500 MG capsule Commonly known as: DURICEF Take 2 capsules (1,000 mg total) by mouth 2 (two) times daily for 7 days.   cholecalciferol  25 MCG (1000 UNIT) tablet Commonly known as: VITAMIN D3 Take 1,000 Units by mouth daily.   divalproex  500 MG DR tablet Commonly known as: DEPAKOTE  Take 1 tablet (500 mg total) by mouth every 12 (twelve) hours. What changed:  medication strength how much to take when to take this  donepezil  10 MG tablet Commonly known as: ARICEPT  Take 10 mg by mouth at bedtime.   ezetimibe  10 MG tablet Commonly known as: ZETIA  Take 10 mg by mouth at bedtime.   feeding supplement Liqd Take 237 mLs by mouth 2 (two) times daily between meals.   hydrOXYzine  25 MG tablet Commonly known as: ATARAX  Take 1 tablet (25 mg total) by mouth every 6 (six) hours as needed for anxiety.   lactobacillus Pack Take 1 packet (1 g total) by mouth 3 (three) times daily with meals for 7 days.   levothyroxine  50 MCG tablet Commonly known as: SYNTHROID  Take 50 mcg by mouth at bedtime.   memantine  10 MG tablet Commonly known as: NAMENDA  Take 10 mg by mouth 2 (two) times daily.   QUEtiapine  25 MG tablet Commonly known as: SEROQUEL  Take 0.5 tablets (12.5 mg total) by mouth at bedtime. What changed:  medication strength how much to take               Durable Medical Equipment  (From admission, onward)           Start     Ordered   11/10/23 1527  For home use only DME Walker rolling  Once       Question Answer Comment  Walker: With 5 Inch Wheels   Patient needs a walker to treat with the following condition Debility      11/10/23 1526              Discharge Care  Instructions  (From admission, onward)           Start     Ordered   11/12/23 0000  Discharge wound care:        11/12/23 1118             The results of significant diagnostics from this hospitalization (including imaging, microbiology, ancillary and laboratory) are listed below for reference.    Procedures and Diagnostic Studies:   EEG adult Result Date: 11/10/2023 Shelton Arlin KIDD, MD     11/10/2023  3:41 PM Patient Name: Emily Berger MRN: [id number] Epilepsy Attending: Arlin KIDD Shelton Referring Physician/Provider: Briana Elgin LABOR, MD Date: 11/10/2023 Duration: 23.26 mins Patient history: 78yo F with recurrent seizures. EEG to evaluate for seizure. Level of alertness: Awake, asleep AEDs during EEG study: VPA Technical aspects: This EEG study was done with scalp electrodes positioned according to the 10-20 International system of electrode placement. Electrical activity was reviewed with band pass filter of 1-70Hz , sensitivity of 7 uV/mm, display speed of 55mm/sec with a 60Hz  notched filter applied as appropriate. EEG data were recorded continuously and digitally stored.  Video monitoring was available and reviewed as appropriate. Description: The posterior dominant rhythm consists of 8 Hz activity of moderate voltage (25-35 uV) seen predominantly in posterior head regions, symmetric and reactive to eye opening and eye closing. Sleep was characterized by vertex waves, sleep spindles (12 to 14 Hz), maximal frontocentral region. Hyperventilation and photic stimulation were not performed.   IMPRESSION: This study is within normal limits. No seizures or epileptiform discharges were seen throughout the recording. A normal interictal EEG does not exclude the diagnosis of epilepsy. Arlin KIDD Shelton     Labs:   Basic Metabolic Panel: Recent Labs  Lab 11/07/23 1930 11/08/23 0531 11/09/23 0331  NA 137 136 135  K 3.7 3.4* 3.7  CL 104 103 101  CO2 24 23 23   GLUCOSE 100* 116* 70  BUN 11  11  11  CREATININE 0.74 0.69 0.65  CALCIUM 8.9 8.4* 8.3*   GFR Estimated Creatinine Clearance: 45.8 mL/min (by C-G formula based on SCr of 0.65 mg/dL). Liver Function Tests: Recent Labs  Lab 11/07/23 1930 11/08/23 0531  AST 25 21  ALT 24 20  ALKPHOS 61 58  BILITOT 0.9 1.0  PROT 6.5 5.8*  ALBUMIN 3.4* 3.1*   No results for input(s): LIPASE, AMYLASE in the last 168 hours. Recent Labs  Lab 11/08/23 2224  AMMONIA <13   Coagulation profile No results for input(s): INR, PROTIME in the last 168 hours.  CBC: Recent Labs  Lab 11/07/23 1930 11/08/23 0531 11/09/23 0331  WBC 6.5 8.7 5.0  NEUTROABS 4.9  --   --   HGB 12.4 11.6* 10.6*  HCT 36.6 34.5* 31.8*  MCV 84.1 83.7 85.3  PLT 253 207 199   Cardiac Enzymes: No results for input(s): CKTOTAL, CKMB, CKMBINDEX, TROPONINI in the last 168 hours. BNP: Invalid input(s): POCBNP CBG: Recent Labs  Lab 11/12/23 0803  GLUCAP 84   D-Dimer No results for input(s): DDIMER in the last 72 hours. Hgb A1c No results for input(s): HGBA1C in the last 72 hours. Lipid Profile No results for input(s): CHOL, HDL, LDLCALC, TRIG, CHOLHDL, LDLDIRECT in the last 72 hours. Thyroid  function studies Recent Labs    11/12/23 0345  TSH 1.158   Anemia work up No results for input(s): VITAMINB12, FOLATE, FERRITIN, TIBC, IRON, RETICCTPCT in the last 72 hours. Microbiology Recent Results (from the past 240 hours)  Urine Culture     Status: Abnormal   Collection Time: 11/08/23  2:28 AM   Specimen: In/Out Cath Urine  Result Value Ref Range Status   Specimen Description   Final    IN/OUT CATH URINE Performed at [facility], [address], [place], [place] [address]    Special Requests   Final    NONE Performed at [facility], [address] [place], [place] [address]    Culture >=100,000 COLONIES/mL ESCHERICHIA COLI (A)  Final   Report Status 11/10/2023  FINAL  Final   Organism ID, Bacteria ESCHERICHIA COLI (A)  Final      Susceptibility   Escherichia coli - MIC*    AMPICILLIN >=32 RESISTANT Resistant     CEFAZOLIN  <=4 SENSITIVE Sensitive     CEFEPIME <=0.12 SENSITIVE Sensitive     CEFTRIAXONE  <=0.25 SENSITIVE Sensitive     CIPROFLOXACIN <=0.25 SENSITIVE Sensitive     GENTAMICIN >=16 RESISTANT Resistant     IMIPENEM <=0.25 SENSITIVE Sensitive     NITROFURANTOIN <=16 SENSITIVE Sensitive     TRIMETH/SULFA >=320 RESISTANT Resistant     AMPICILLIN/SULBACTAM 16 INTERMEDIATE Intermediate     PIP/TAZO <=4 SENSITIVE Sensitive ug/mL    * >=100,000 COLONIES/mL ESCHERICHIA COLI    Time coordinating discharge: 45 minutes  Signed: Orvis Stann  [facility] 11/12/2023, 12:52 PM

## 2023-11-12 NOTE — Progress Notes (Signed)
 Mobility Specialist - Progress Note   11/12/23 0920  Mobility  Activity Ambulated with assistance in room  Level of Assistance Contact guard assist, steadying assist  Assistive Device Front wheel walker  Distance Ambulated (ft) 15 ft (x2)  Range of Motion/Exercises Active  Activity Response Tolerated well  Mobility Referral Yes  Mobility visit 1 Mobility  Mobility Specialist Start Time (ACUTE ONLY) 0920  Mobility Specialist Stop Time (ACUTE ONLY) [id number]  Mobility Specialist Time Calculation (min) (ACUTE ONLY) 17 min   Received in bed and agreed to mobility. Had no issues throughout session. Ambulated to restroom then left in recliner with all needs met. Staff in room.  Cyndee Ada Mobility Specialist

## 2023-11-12 NOTE — TOC Transition Note (Signed)
 Transition of Care [facility]) - Discharge Note  Patient Details  Name: Emily Berger MRN: [id number] Date of Birth: December 21, 1945  Transition of Care [facility]) CM/SW Contact:  Duwaine GORMAN Aran, LCSW Phone Number: 11/12/2023, 11:39 AM  Clinical Narrative: Patient will discharge home today. CSW notified Shawn with [facility]. CSW confirmed with daughter-in-law that rolling walker was delivered to room. TOC signing off.  Final next level of care: Home w Home Health Services Barriers to Discharge: Barriers Resolved  Patient Goals and CMS Choice Patient states their goals for this hospitalization and ongoing recovery are:: Get [facility] through [facility] CMS Medicare.gov Compare Post Acute Care list provided to:: Patient Represenative (must comment) Emily Berger (daughter-in-law)) Choice offered to / list presented to :  (Daughter-in-law)  Discharge Plan and Services Additional resources added to the After Visit Summary for   In-house Referral: Clinical Social Work Post Acute Care Choice: Horticulturist, commercial, Home Health          DME Arranged: Vannie rolling DME Agency: [employer] Date DME Agency Contacted: 11/10/23 Time DME Agency Contacted: 1447 Representative spoke with at DME Agency: Zack HH Arranged: PT, OT HH Agency: Other - See comment Vannie) Date HH Agency Contacted: 11/10/23 Time HH Agency Contacted: 1422 Representative spoke with at [facility] Agency: Shawn  Social Drivers of Health (SDOH) Interventions SDOH Screenings   Food Insecurity: No Food Insecurity (11/08/2023)  Housing: Low Risk  (11/08/2023)  Transportation Needs: No Transportation Needs (11/08/2023)  Utilities: Not At Risk (11/08/2023)  Social Connections: Socially Isolated (11/08/2023)  Tobacco Use: Low Risk  (11/08/2023)   Readmission Risk Interventions     No data to display

## 2023-11-12 NOTE — Progress Notes (Signed)
 Occupational Therapy Treatment Patient Details Name: Emily Berger MRN: [id number] DOB: Sep 25, 1945 Today's Date: 11/12/2023   History of present illness  Patient is a 78 yr old female who presented 11/07/23 for seizure and fall resulting in subarachnoid hemorrhage.PMH: seizure,heart murmur, hypothy, ventricular septal defect, dementia.   OT comments  The pt presented with good participation in the session. She was instructed on toileting and grooming at bathroom level. She required CGA to min assist overall, with intermittent cues required for short-term memory/recall, as well as for problem-solving. Per her daughter-in-law who was present, the pt typically requires close supervision, increased cues, and occasional assistance for self-care management, given her cognitive deficits relative to dementia. The pt will continue to benefit from OT services to maximize her independence and safety with self-care tasks. Home health OT is recommended.       If plan is discharge home, recommend the following:  A little help with walking and/or transfers;A little help with bathing/dressing/bathroom;Assistance with cooking/housework;Supervision due to cognitive status;Direct supervision/assist for medications management;Assist for transportation   Equipment Recommendations  None recommended by OT    Recommendations for Other Services      Precautions / Restrictions Precautions Precautions: Fall Restrictions Weight Bearing Restrictions Per Provider Order: No       Mobility Bed Mobility               General bed mobility comments: she was received seated in the chair    Transfers Overall transfer level: Needs assistance Equipment used: Rolling walker (2 wheels) Transfers: Sit to/from Stand Sit to Stand: Contact guard assist                 Balance     Sitting balance-Leahy Scale: Normal         Standing balance comment: CGA with RW      -     ADL either performed or  assessed with clinical judgement   ADL Overall ADL's : Needs assistance/impaired     Grooming: Contact guard assist;Standing;Cueing for safety Grooming Details (indicate cue type and reason): She requires steadying assist in standing at the sink to perform hand washing and teeth brushing. She further required occasional cues for short-term recall, item identification, and problem-solving.             Lower Body Dressing: Contact guard assist;Sitting/lateral leans Lower Body Dressing Details (indicate cue type and reason): She demonstrated the crossed leg technique seated in the chair, to doff then don her socks. Toilet Transfer: Contact guard assist;Rolling walker (2 wheels);Ambulation;Cueing for sequencing;Cueing for safety Toilet Transfer Details (indicate cue type and reason): She ambulated to and from the bathroom in the room using a RW. She required min verbal cues for walker placement and general safety. Toileting- Clothing Manipulation and Hygiene: Minimal assistance;Sit to/from stand;Cueing for safety              - Communication Communication Communication: No apparent difficulties   Cognition Arousal: Alert Behavior During Therapy: WFL for tasks assessed/performed Cognition: History of cognitive impairments   Orientation impairments: Situation Awareness: Online awareness impaired Memory impairment (select all impairments): Non-declarative long-term memory Attention impairment (select first level of impairment): Divided attention Executive functioning impairment (select all impairments): Organization OT - Cognition Comments: she has a history of dementia                   Following commands impaired:  (Follows 1 step commands with occasional repetition of prompts)      -  Pertinent Vitals/ Pain       Pain Assessment Pain Assessment: No/denies pain  -  Frequency  Min 2X/week        Progress Toward Goals  OT Goals(current goals  can now be found in the care plan section)  Progress towards OT goals: Progressing toward goals  Acute Rehab OT Goals OT Goal Formulation: With patient/family Time For Goal Achievement: 11/23/23 Potential to Achieve Goals: Good  Plan      -   AM-PAC OT 6 Clicks Daily Activity     Outcome Measure   Help from another person eating meals?: A Little Help from another person taking care of personal grooming?: A Little Help from another person toileting, which includes using toliet, bedpan, or urinal?: A Little Help from another person bathing (including washing, rinsing, drying)?: A Little Help from another person to put on and taking off regular upper body clothing?: A Little Help from another person to put on and taking off regular lower body clothing?: A Little 6 Click Score: 18    End of Session Equipment Utilized During Treatment: Gait belt;Rolling walker (2 wheels)  OT Visit Diagnosis: Unsteadiness on feet (R26.81);Muscle weakness (generalized) (M62.81);Other symptoms and signs involving cognitive function;History of falling (Z91.81)   Activity Tolerance Patient tolerated treatment well   Patient Left in chair;with call bell/phone within reach;with chair alarm set;with family/visitor present   Nurse Communication Mobility status        Time: 8683-8656 OT Time Calculation (min): 27 min  Charges: OT General Charges $OT Visit: 1 Visit OT Treatments $Self Care/Home Management : 8-22 mins $Therapeutic Activity: 8-22 mins     Delanna LITTIE Molt, OTR/L 11/12/2023, 5:42 PM

## 2024-01-07 ENCOUNTER — Emergency Department (HOSPITAL_COMMUNITY)

## 2024-01-07 ENCOUNTER — Emergency Department (HOSPITAL_COMMUNITY)
Admission: EM | Admit: 2024-01-07 | Discharge: 2024-01-08 | Disposition: A | Discharge status: Home / Self Care | Attending: Emergency Medicine | Admitting: Emergency Medicine

## 2024-01-07 ENCOUNTER — Other Ambulatory Visit: Payer: Self-pay

## 2024-01-07 DIAGNOSIS — Y9289 Other specified places as the place of occurrence of the external cause: Secondary | ICD-10-CM | POA: Diagnosis not present

## 2024-01-07 DIAGNOSIS — I959 Hypotension, unspecified: Secondary | ICD-10-CM | POA: Diagnosis not present

## 2024-01-07 DIAGNOSIS — Z8673 Personal history of transient ischemic attack (TIA), and cerebral infarction without residual deficits: Secondary | ICD-10-CM | POA: Diagnosis not present

## 2024-01-07 DIAGNOSIS — F32A Depression, unspecified: Secondary | ICD-10-CM | POA: Diagnosis not present

## 2024-01-07 DIAGNOSIS — I951 Orthostatic hypotension: Secondary | ICD-10-CM | POA: Diagnosis present

## 2024-01-07 DIAGNOSIS — Z91040 Latex allergy status: Secondary | ICD-10-CM | POA: Diagnosis not present

## 2024-01-07 DIAGNOSIS — Y9301 Activity, walking, marching and hiking: Secondary | ICD-10-CM | POA: Diagnosis not present

## 2024-01-07 DIAGNOSIS — F411 Generalized anxiety disorder: Secondary | ICD-10-CM | POA: Insufficient documentation

## 2024-01-07 DIAGNOSIS — E041 Nontoxic single thyroid nodule: Secondary | ICD-10-CM | POA: Insufficient documentation

## 2024-01-07 DIAGNOSIS — S0990XA Unspecified injury of head, initial encounter: Secondary | ICD-10-CM | POA: Insufficient documentation

## 2024-01-07 DIAGNOSIS — E039 Hypothyroidism, unspecified: Secondary | ICD-10-CM | POA: Insufficient documentation

## 2024-01-07 DIAGNOSIS — R569 Unspecified convulsions: Secondary | ICD-10-CM | POA: Insufficient documentation

## 2024-01-07 DIAGNOSIS — F0390 Unspecified dementia without behavioral disturbance: Secondary | ICD-10-CM | POA: Insufficient documentation

## 2024-01-07 DIAGNOSIS — Z043 Encounter for examination and observation following other accident: Secondary | ICD-10-CM | POA: Diagnosis not present

## 2024-01-07 DIAGNOSIS — Z9181 History of falling: Secondary | ICD-10-CM | POA: Diagnosis present

## 2024-01-07 DIAGNOSIS — W19XXXA Unspecified fall, initial encounter: Secondary | ICD-10-CM | POA: Diagnosis not present

## 2024-01-07 DIAGNOSIS — Z8774 Personal history of (corrected) congenital malformations of heart and circulatory system: Secondary | ICD-10-CM | POA: Diagnosis not present

## 2024-01-07 DIAGNOSIS — Z79899 Other long term (current) drug therapy: Secondary | ICD-10-CM | POA: Insufficient documentation

## 2024-01-07 LAB — COMPREHENSIVE METABOLIC PANEL WITH GFR
ALT: 14 U/L (ref 0–44)
AST: 20 U/L (ref 15–41)
Albumin: 3.2 g/dL — ABNORMAL LOW (ref 3.5–5.0)
Alkaline Phosphatase: 48 U/L (ref 38–126)
Anion gap: 10 (ref 5–15)
BUN: 18 mg/dL (ref 8–23)
CO2: 23 mmol/L (ref 22–32)
Calcium: 8.4 mg/dL — ABNORMAL LOW (ref 8.9–10.3)
Chloride: 104 mmol/L (ref 98–111)
Creatinine, Ser: 0.97 mg/dL (ref 0.44–1.00)
GFR, Estimated: 60 mL/min — ABNORMAL LOW (ref >60)
Glucose, Bld: 142 mg/dL — ABNORMAL HIGH (ref 70–99)
Potassium: 4 mmol/L (ref 3.5–5.1)
Sodium: 137 mmol/L (ref 135–145)
Total Bilirubin: 0.6 mg/dL (ref 0.0–1.2)
Total Protein: 5.8 g/dL — ABNORMAL LOW (ref 6.5–8.1)

## 2024-01-07 LAB — CBC WITH DIFFERENTIAL/PLATELET
Abs Immature Granulocytes: 0.04 K/uL (ref 0.00–0.07)
Basophils Absolute: 0.1 K/uL (ref 0.0–0.1)
Basophils Relative: 1 %
Eosinophils Absolute: 0.1 K/uL (ref 0.0–0.5)
Eosinophils Relative: 1 %
HCT: 33.6 % — ABNORMAL LOW (ref 36.0–46.0)
Hemoglobin: 11.1 g/dL — ABNORMAL LOW (ref 12.0–15.0)
Immature Granulocytes: 1 %
Lymphocytes Relative: 9 %
Lymphs Abs: 0.7 K/uL (ref 0.7–4.0)
MCH: 29.4 pg (ref 26.0–34.0)
MCHC: 33 g/dL (ref 30.0–36.0)
MCV: 88.9 fL (ref 80.0–100.0)
Monocytes Absolute: 0.6 K/uL (ref 0.1–1.0)
Monocytes Relative: 7 %
Neutro Abs: 6.8 K/uL (ref 1.7–7.7)
Neutrophils Relative %: 81 %
Platelets: 227 K/uL (ref 150–400)
RBC: 3.78 MIL/uL — ABNORMAL LOW (ref 3.87–5.11)
RDW: 14.9 % (ref 11.5–15.5)
WBC: 8.3 K/uL (ref 4.0–10.5)
nRBC: 0 % (ref 0.0–0.2)

## 2024-01-07 LAB — URINALYSIS, ROUTINE W REFLEX MICROSCOPIC
Bilirubin Urine: NEGATIVE
Glucose, UA: NEGATIVE mg/dL
Hgb urine dipstick: NEGATIVE
Ketones, ur: NEGATIVE mg/dL
Leukocytes,Ua: NEGATIVE
Nitrite: NEGATIVE
Protein, ur: NEGATIVE mg/dL
Specific Gravity, Urine: 1.015 (ref 1.005–1.030)
pH: 6.5 (ref 5.0–8.0)

## 2024-01-07 LAB — LIPASE, BLOOD: Lipase: 43 U/L (ref 11–51)

## 2024-01-07 LAB — RESP PANEL BY RT-PCR (RSV, FLU A&B, COVID)  RVPGX2
Influenza A by PCR: NEGATIVE
Influenza B by PCR: NEGATIVE
Resp Syncytial Virus by PCR: NEGATIVE
SARS Coronavirus 2 by RT PCR: NEGATIVE

## 2024-01-07 LAB — CK: Total CK: 34 U/L — ABNORMAL LOW (ref 38–234)

## 2024-01-07 LAB — I-STAT CG4 LACTIC ACID, ED: Lactic Acid, Venous: 1.3 mmol/L (ref 0.5–1.9)

## 2024-01-07 MED ORDER — SODIUM CHLORIDE 0.9 % IV BOLUS
1000.0000 mL | Freq: Once | INTRAVENOUS | Status: AC
  Administered 2024-01-07: 1000 mL via INTRAVENOUS

## 2024-01-07 NOTE — Discharge Instructions (Signed)
 It was a pleasure caring for you today in the emergency department.  A nodule was noted on your thyroid  as part of your workup today, please follow up with PCP for a non-emergent thyroid  ultrasound to further evaluate   Please return to the emergency department for any worsening or worrisome symptoms.

## 2024-01-07 NOTE — ED Provider Notes (Signed)
 Care of patient received from prior provider at 11:20 PM, please see their note for complete H/P and care plan.  Received handoff per ED course.  Clinical Course as of 01/07/24 2320  Thu Jan 07, 2024  1958 Hemoglobin(!): 11.1 Similar to prior [SG]  2320 Fall today HX of dementia Ambulation trial and reassess  [CC]    Clinical Course User Index [CC] Jerral Meth, MD [SG] Elnor Jayson LABOR, DO    Reassessment: Per nursing, patient had no challenge and ambulating.  Patient discharged per prior care plan.     Jerral Meth, MD 01/08/24 [phone]

## 2024-01-07 NOTE — ED Provider Notes (Signed)
 [place] EMERGENCY DEPARTMENT AT [facility] Provider Note  CSN: [id number] Arrival date & time: 01/07/24 1706  Chief Complaint(s) Fall and Hypotension  HPI Emily Berger is a 78 y.o. female with past medical history as below, significant for VSD, hypothyroid, subarachnoid hemorrhage, dementia, MDD, GAD, seizure who presents to the ED with complaint of hypotension, fall  Patient arrives by EMS, report of mechanical trip and fall, family/bystander tried to help her up to her feet and she fell again.  Patient does not believe she had LOC, there is no report of seizure activity.  On EMS arrival blood pressure was 70 systolic, she was given IV fluids and blood pressure has improved to 80s to 90s systolic.  On initial assessment patient has no complaints other than irritation from where the IV was placed by EMS.  Denies any nausea vomiting diarrhea pain to her extremities, chest pain or abdominal pain.  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    as a child - no surgery to correct this - [facility]   Heart murmur    slight   History of hiatal hernia    Hypothyroidism    PONV (postoperative nausea and vomiting)    Patient Active Problem List   Diagnosis Date Noted   Seizure (HCC) 11/08/2023   Subarachnoid hemorrhage (HCC) 11/08/2023   History of dementia 11/08/2023   Hypothyroidism 11/08/2023   Altered mental status 11/08/2023   MDD (major depressive disorder) 10/26/2023   Dementia with behavioral disturbance (HCC) 10/25/2023   Generalized anxiety disorder 10/25/2023   Trimalleolar fracture of ankle, closed, left, sequela 07/22/2018   Home Medication(s) Prior to Admission medications   Medication Sig Start Date End Date Taking? Authorizing Provider  cholecalciferol  (VITAMIN D3) 25 MCG (1000 UT) tablet Take 1,000 Units by mouth  daily.    [provider]  divalproex  (DEPAKOTE ) 500 MG DR tablet Take 1 tablet (500 mg total) by mouth every 12 (twelve) hours. 11/12/23 12/12/23  Arlice Reichert, MD  donepezil  (ARICEPT ) 10 MG tablet Take 10 mg by mouth at bedtime. 09/01/23   [provider]  ezetimibe  (ZETIA ) 10 MG tablet Take 10 mg by mouth at bedtime. 08/10/23   [provider]  feeding supplement (ENSURE PLUS HIGH PROTEIN) LIQD Take 237 mLs by mouth 2 (two) times daily between meals. 11/12/23   Arlice Reichert, MD  hydrOXYzine  (ATARAX ) 25 MG tablet Take 1 tablet (25 mg total) by mouth every 6 (six) hours as needed for anxiety. 11/04/23   Ula Prentice SAUNDERS, MD  levothyroxine  (SYNTHROID ) 50 MCG tablet Take 50 mcg by mouth at bedtime.    [provider]  memantine  (NAMENDA ) 10 MG tablet Take 10 mg by mouth 2 (two) times daily. 09/01/23 08/31/24  [provider]  QUEtiapine  (SEROQUEL ) 25 MG tablet Take 0.5 tablets (12.5 mg total) by mouth at bedtime. 11/12/23 12/12/23  Arlice Reichert, MD  Past Surgical History Past Surgical History:  Procedure Laterality Date   ABDOMINAL HYSTERECTOMY     APPENDECTOMY     CARDIAC CATHETERIZATION     8x as a child   CHOLECYSTECTOMY     NASAL SEPTUM SURGERY     ORIF ANKLE FRACTURE Left 07/22/2018   Procedure: Open reduction internal fixation left ankle trimalleolar fracture/dislocation;  Surgeon: Kit Rush, MD;  Location: [facility] OR;  Service: Orthopedics;  Laterality: Left;  120 mins   TONSILLECTOMY     TOOTH EXTRACTION     Family History History reviewed. No pertinent family history.  Social History Social History   Tobacco Use   Smoking status: Never   Smokeless tobacco: Never  Vaping Use   Vaping status: Never Used  Substance Use Topics   Alcohol use: Never   Drug use: Never   Allergies Latex, Ambien [zolpidem tartrate], Latex,  and Percocet [oxycodone -acetaminophen ]  Review of Systems A thorough review of systems was obtained and all systems are negative except as noted in the HPI and PMH.   Physical Exam Vital Signs  I have reviewed the triage vital signs BP 122/71   Pulse 62   Temp 98 F (36.7 C)   Resp 18   Ht 5' 6 (1.676 m)   SpO2 100%   BMI 17.83 kg/m  Physical Exam Vitals and nursing note reviewed.  Constitutional:      General: She is not in acute distress.    Appearance: Normal appearance.  HENT:     Head: Normocephalic and atraumatic.     Right Ear: External ear normal.     Left Ear: External ear normal.     Nose: Nose normal.     Mouth/Throat:     Mouth: Mucous membranes are dry.  Eyes:     General: No scleral icterus.       Right eye: No discharge.        Left eye: No discharge.  Cardiovascular:     Rate and Rhythm: Normal rate and regular rhythm.     Pulses: Normal pulses.     Heart sounds: Normal heart sounds.  Pulmonary:     Effort: Pulmonary effort is normal. No respiratory distress.     Breath sounds: Normal breath sounds. No stridor.  Abdominal:     General: Abdomen is flat. There is no distension.     Palpations: Abdomen is soft.     Tenderness: There is no abdominal tenderness.  Musculoskeletal:     Cervical back: No rigidity.     Right lower leg: No edema.     Left lower leg: No edema.     Comments: Pelvis stable to AP pressure, no pain to lower extremities with logroll, moving extremity spontaneously and w/o obvious discomfort  Skin:    General: Skin is warm and dry.     Capillary Refill: Capillary refill takes less than 2 seconds.  Neurological:     Mental Status: She is alert.  Psychiatric:        Mood and Affect: Mood normal.        Behavior: Behavior normal. Behavior is cooperative.     ED Results and Treatments Labs (all labs ordered are listed, but only abnormal results are displayed) Labs Reviewed  CBC WITH DIFFERENTIAL/PLATELET - Abnormal; Notable  for the following components:      Result Value   RBC 3.78 (*)    Hemoglobin 11.1 (*)    HCT 33.6 (*)    All other components  within normal limits  COMPREHENSIVE METABOLIC PANEL WITH GFR - Abnormal; Notable for the following components:   Glucose, Bld 142 (*)    Calcium 8.4 (*)    Total Protein 5.8 (*)    Albumin 3.2 (*)    GFR, Estimated 60 (*)    All other components within normal limits  CK - Abnormal; Notable for the following components:   Total CK 34 (*)    All other components within normal limits  RESP PANEL BY RT-PCR (RSV, FLU A&B, COVID)  RVPGX2  LIPASE, BLOOD  URINALYSIS, ROUTINE W REFLEX MICROSCOPIC  I-STAT CG4 LACTIC ACID, ED                                                                                                                          Radiology No results found.  Pertinent labs & imaging results that were available during my care of the patient were reviewed by me and considered in my medical decision making (see MDM for details).  Medications Ordered in ED Medications  sodium chloride  0.9 % bolus 1,000 mL (0 mLs Intravenous Stopped 01/07/24 2341)                                                                                                                                     Procedures Procedures  (including critical care time)  Medical Decision Making / ED Course    Medical Decision Making:    Emily Berger is a 78 y.o. female with past medical history as below, significant for VSD, hypothyroid, subarachnoid hemorrhage, dementia, MDD, GAD, seizure who presents to the ED with complaint of hypotension, fall. The complaint involves an extensive differential diagnosis and also carries with it a high risk of complications and morbidity.  Serious etiology was considered. Ddx includes but is not limited to: Orthostatic, vasovagal, seizure, hypovolemia, fracture, ICH, concussion infection, etc.  Complete initial physical exam performed, notably the  patient was in no acute distress, sitting upright on stretcher.    Reviewed and confirmed nursing documentation for past medical history, family history, social history.  Vital signs reviewed.    Fall Hypotension > - Story seems most consistent with orthostatic hypotension or vasovagal syncope. - Blood pressure improved precipitously with fluids, she is tolerating p.o. without difficulty.  Does report poor p.o. over the last week or so, likely secondary to underlying dementia - Labs and imaging  are stable, she is feeling better, back to baseline -Incidental thyroid  nodule noted on imaging, encouraged outpatient follow-up for ultrasound -Encourage increased p.o. intake, fall precautions at home - Patient uses a walker at home, family has a walker and will bring it to help assist patient with trialing ambulation.  She is able to ambulate without difficulty would be reasonable discharge home.  Handoff to incoming EDP pending trial ambulation  Clinical Course as of 01/11/24 0754  Thu Jan 07, 2024  1958 Hemoglobin(!): 11.1 Similar to prior [SG]  2320 Fall today HX of dementia Ambulation trial and reassess  [CC]    Clinical Course User Index [CC] Jerral Meth, MD [SG] Elnor Jayson LABOR, DO                    Additional history obtained: -Additional history obtained from family -External records from outside source obtained and reviewed including: Chart review including previous notes, labs, imaging, consultation notes including  Home medications, prior admission   Lab Tests: -I ordered, reviewed, and interpreted labs.   The pertinent results include:   Labs Reviewed  CBC WITH DIFFERENTIAL/PLATELET - Abnormal; Notable for the following components:      Result Value   RBC 3.78 (*)    Hemoglobin 11.1 (*)    HCT 33.6 (*)    All other components within normal limits  COMPREHENSIVE METABOLIC PANEL WITH GFR - Abnormal; Notable for the following components:   Glucose,  Bld 142 (*)    Calcium 8.4 (*)    Total Protein 5.8 (*)    Albumin 3.2 (*)    GFR, Estimated 60 (*)    All other components within normal limits  CK - Abnormal; Notable for the following components:   Total CK 34 (*)    All other components within normal limits  RESP PANEL BY RT-PCR (RSV, FLU A&B, COVID)  RVPGX2  LIPASE, BLOOD  URINALYSIS, ROUTINE W REFLEX MICROSCOPIC  I-STAT CG4 LACTIC ACID, ED    Notable for labs are stable  EKG   EKG Interpretation Date/Time:  Thursday January 07 2024 21:10:45 EDT Ventricular Rate:  66 PR Interval:  157 QRS Duration:  97 QT Interval:  466 QTC Calculation: 489 R Axis:   46  Text Interpretation: Sinus rhythm RSR' in V1 or V2, right VCD or RVH Borderline prolonged QT interval Confirmed by Elnor Jayson ([id number]) on 01/07/2024 10:06:19 PM         Imaging Studies ordered: I ordered imaging studies including chest x-ray, pelvis x-ray, CT head and cervical spine I independently visualized the following imaging with scope of interpretation limited to determining acute life threatening conditions related to emergency care; findings noted above I agree with the radiologist interpretation If any imaging was obtained with contrast I closely monitored patient for any possible adverse reaction a/w contrast administration in the emergency department   Medicines ordered and prescription drug management: Meds ordered this encounter  Medications   sodium chloride  0.9 % bolus 1,000 mL    -I have reviewed the patients home medicines and have made adjustments as needed   Consultations Obtained: Not applicable  Cardiac Monitoring: The patient was maintained on a cardiac monitor.  I personally viewed and interpreted the cardiac monitored which showed an underlying rhythm of: NSR Continuous pulse oximetry interpreted by myself, 100% on RA.    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

## 2024-01-07 NOTE — ED Triage Notes (Addendum)
 Pt bib [facility] from home. Pt was walking down hallway and tripped and fell no injury. Family helped her stand back up and pt had syncopal episode when standing. Initial pressure with ems was 78/60 1 liter of fluid bp went to 84/58.  18G LAC 20G RAC

## 2024-01-08 ENCOUNTER — Encounter (HOSPITAL_COMMUNITY): Payer: Self-pay

## 2024-01-08 NOTE — ED Notes (Signed)
 PT D/C'D AFTER INSTRUCTIONS REVIEWED. PT VERBALIZED UNDERSTANDING. NAD REPORTED OR NOTED AT THIS TIME.

## 2024-04-25 ENCOUNTER — Emergency Department (HOSPITAL_COMMUNITY)
Admission: EM | Admit: 2024-04-25 | Discharge: 2024-04-25 | Disposition: A | Discharge status: Home / Self Care | Attending: Emergency Medicine | Admitting: Emergency Medicine

## 2024-04-25 ENCOUNTER — Emergency Department (HOSPITAL_COMMUNITY)

## 2024-04-25 DIAGNOSIS — Z91040 Latex allergy status: Secondary | ICD-10-CM | POA: Diagnosis not present

## 2024-04-25 DIAGNOSIS — W19XXXA Unspecified fall, initial encounter: Secondary | ICD-10-CM | POA: Diagnosis not present

## 2024-04-25 DIAGNOSIS — D72829 Elevated white blood cell count, unspecified: Secondary | ICD-10-CM | POA: Diagnosis not present

## 2024-04-25 DIAGNOSIS — F0280 Dementia in other diseases classified elsewhere without behavioral disturbance: Secondary | ICD-10-CM | POA: Diagnosis not present

## 2024-04-25 DIAGNOSIS — S60512A Abrasion of left hand, initial encounter: Secondary | ICD-10-CM | POA: Diagnosis not present

## 2024-04-25 DIAGNOSIS — T68XXXA Hypothermia, initial encounter: Secondary | ICD-10-CM | POA: Diagnosis not present

## 2024-04-25 DIAGNOSIS — T07XXXA Unspecified multiple injuries, initial encounter: Secondary | ICD-10-CM

## 2024-04-25 DIAGNOSIS — G309 Alzheimer's disease, unspecified: Secondary | ICD-10-CM | POA: Diagnosis not present

## 2024-04-25 DIAGNOSIS — X31XXXA Exposure to excessive natural cold, initial encounter: Secondary | ICD-10-CM | POA: Insufficient documentation

## 2024-04-25 DIAGNOSIS — R8289 Other abnormal findings on cytological and histological examination of urine: Secondary | ICD-10-CM | POA: Diagnosis not present

## 2024-04-25 DIAGNOSIS — S6992XA Unspecified injury of left wrist, hand and finger(s), initial encounter: Secondary | ICD-10-CM | POA: Diagnosis present

## 2024-04-25 DIAGNOSIS — S60511A Abrasion of right hand, initial encounter: Secondary | ICD-10-CM | POA: Insufficient documentation

## 2024-04-25 LAB — URINALYSIS, ROUTINE W REFLEX MICROSCOPIC
Bilirubin Urine: NEGATIVE
Glucose, UA: NEGATIVE mg/dL
Ketones, ur: NEGATIVE mg/dL
Leukocytes,Ua: NEGATIVE
Nitrite: POSITIVE — AB
Protein, ur: NEGATIVE mg/dL
Specific Gravity, Urine: >1.03 — ABNORMAL HIGH (ref 1.005–1.030)
pH: 6 (ref 5.0–8.0)

## 2024-04-25 LAB — BASIC METABOLIC PANEL WITH GFR
Anion gap: 10 (ref 5–15)
BUN: 23 mg/dL (ref 8–23)
CO2: 27 mmol/L (ref 22–32)
Calcium: 9.3 mg/dL (ref 8.9–10.3)
Chloride: 101 mmol/L (ref 98–111)
Creatinine, Ser: 0.68 mg/dL (ref 0.44–1.00)
GFR, Estimated: >60 mL/min
Glucose, Bld: 92 mg/dL (ref 70–99)
Potassium: 4.2 mmol/L (ref 3.5–5.1)
Sodium: 138 mmol/L (ref 135–145)

## 2024-04-25 LAB — HEPATIC FUNCTION PANEL
ALT: 30 U/L (ref 0–44)
AST: 41 U/L (ref 15–41)
Albumin: 4.1 g/dL (ref 3.5–5.0)
Alkaline Phosphatase: 70 U/L (ref 38–126)
Bilirubin, Direct: 0.1 mg/dL (ref 0.0–0.2)
Indirect Bilirubin: 0.3 mg/dL (ref 0.3–0.9)
Total Bilirubin: 0.4 mg/dL (ref 0.0–1.2)
Total Protein: 7 g/dL (ref 6.5–8.1)

## 2024-04-25 LAB — CBC
HCT: 36.8 % (ref 36.0–46.0)
Hemoglobin: 12.6 g/dL (ref 12.0–15.0)
MCH: 29.1 pg (ref 26.0–34.0)
MCHC: 34.2 g/dL (ref 30.0–36.0)
MCV: 85 fL (ref 80.0–100.0)
Platelets: 258 K/uL (ref 150–400)
RBC: 4.33 MIL/uL (ref 3.87–5.11)
RDW: 13.6 % (ref 11.5–15.5)
WBC: 12.9 K/uL — ABNORMAL HIGH (ref 4.0–10.5)
nRBC: 0 % (ref 0.0–0.2)

## 2024-04-25 LAB — URINALYSIS, MICROSCOPIC (REFLEX)

## 2024-04-25 NOTE — ED Triage Notes (Signed)
 PT BIB [facility] d/t found this morning around 7am outside found down an embankment , pt states she had been outside all night per ems. Upon EMS arrival pt core was warm , hands / feet cold. Pt c/o finger pain 3 sores on right fingers.  Hx alzheimer's A/ox1 (baseline)   BP 110/74  84 HR 98% RA 120 CBG 18 RR

## 2024-04-25 NOTE — ED Notes (Signed)
 Patient transported to CT

## 2024-04-25 NOTE — Discharge Instructions (Addendum)
 You were evaluated today for a fall and for exposure to the cold.  Your initial body temperature was low but you have we warm to a normal temperature here.  You were evaluated with CT scan of your head and neck and x-rays.  No acute abnormalities or broken bones were found. You appear to be stable for discharge to home.  Please follow-up with your doctor return if needed

## 2024-04-25 NOTE — ED Notes (Signed)
 X-ray at bedside.

## 2024-04-25 NOTE — ED Provider Notes (Signed)
 " [place] [facility] Provider Note   CSN: [id number] Arrival date & time: 04/25/24  9141     Patient presents with: Cold Exposure   Emily Berger is a 78 y.o. female.   HPI 78 year old female history of Alzheimer's dementia last seen last night around 11:00.  She normally sleeps on the couch.  Her daughter-in-law noted that she was not in the bed around 6 AM.  She found her outside down as some light embankment.  She was sitting on her appeared to be a log slumped forward.  She did not initially respond to voice.  EMS transported her to the hospital.  She currently is complaining of pain to her hands.  She is unable to give me further history.    Prior to Admission medications  Medication Sig Start Date End Date Taking? Authorizing Provider  Cholecalciferol  (VITAMIN D -3 PO) Take 1 capsule by mouth at bedtime.   Yes [provider]  divalproex  (DEPAKOTE ) 500 MG DR tablet Take 1 tablet (500 mg total) by mouth every 12 (twelve) hours. 11/12/23 06/11/24 Yes Dahal, Chapman, MD  donepezil  (ARICEPT ) 10 MG tablet Take 10 mg by mouth at bedtime. 09/01/23  Yes [provider]  ezetimibe  (ZETIA ) 10 MG tablet Take 10 mg by mouth at bedtime. 08/10/23  Yes [provider]  hydrOXYzine  (ATARAX ) 25 MG tablet Take 1 tablet (25 mg total) by mouth every 6 (six) hours as needed for anxiety. 11/04/23  Yes Ula Prentice SAUNDERS, MD  levothyroxine  (SYNTHROID ) 50 MCG tablet Take 50 mcg by mouth at bedtime.   Yes [provider]  LORazepam  (ATIVAN ) 1 MG tablet Take 1 mg by mouth daily as needed for sedation (agitation).   Yes [provider]  memantine  (NAMENDA ) 10 MG tablet Take 10 mg by mouth 2 (two) times daily. 09/01/23 08/31/24 Yes [provider]  QUEtiapine  (SEROQUEL ) 50 MG tablet Take 50 mg by mouth at bedtime.   Yes [provider]  risperiDONE  (RISPERDAL ) 0.25 MG tablet Take 0.25 mg by mouth at bedtime.   Yes [provider]  sertraline  (ZOLOFT ) 25 MG tablet Take 25 mg by mouth at bedtime.   Yes [provider]    Allergies: Latex, Ambien [zolpidem tartrate], and Percocet [oxycodone -acetaminophen ]    Review of Systems  Updated Vital Signs BP 137/75   Pulse 86   Temp 98.5 F (36.9 C)   Resp 18   Ht 1.676 m (5' 6)   Wt 50.1 kg   SpO2 100%   BMI 17.83 kg/m   Physical Exam Vitals and nursing note reviewed.  Constitutional:      Appearance: Normal appearance.  HENT:     Head: Normocephalic.     Right Ear: External ear normal.     Left Ear: External ear normal.     Nose: Nose normal.     Mouth/Throat:     Pharynx: Oropharynx is clear.  Eyes:     Extraocular Movements: Extraocular movements intact.     Pupils: Pupils are equal, round, and reactive to light.  Cardiovascular:     Rate and Rhythm: Normal rate.     Pulses: Normal pulses.  Pulmonary:     Effort: Pulmonary effort is normal.     Breath sounds: Normal breath sounds.  Abdominal:     General: Abdomen is flat.  Musculoskeletal:     Cervical back: Normal range of motion.     Comments: Abrasions to dorsal aspect hands  bilaterally no obvious deformity  Skin:    General: Skin is dry.     Capillary Refill: Capillary refill takes less than 2 seconds.     Comments: Cool to palpation  Neurological:     General: No focal deficit present.     Mental Status: She is alert.  Psychiatric:        Mood and Affect: Mood normal.     (all labs ordered are listed, but only abnormal results are displayed) Labs Reviewed  CBC - Abnormal; Notable for the following components:      Result Value   WBC 12.9 (*)    All other components within normal limits  URINALYSIS, ROUTINE W REFLEX MICROSCOPIC - Abnormal; Notable for the following components:   Specific Gravity, Urine >1.030 (*)    Hgb urine dipstick SMALL (*)    Nitrite POSITIVE (*)    All other components within normal limits  URINALYSIS, MICROSCOPIC (REFLEX) -  Abnormal; Notable for the following components:   Bacteria, UA FEW (*)    All other components within normal limits  BASIC METABOLIC PANEL WITH GFR  HEPATIC FUNCTION PANEL    EKG: None  Radiology: DG Hip Unilat W or Wo Pelvis 2-3 Views Left Result Date: 04/25/2024 CLINICAL DATA:  Fall.  Left hip pain. EXAM: DG HIP (WITH OR WITHOUT PELVIS) 3V LEFT COMPARISON:  None Available. FINDINGS: There is no evidence of hip fracture or dislocation. There is no evidence of arthropathy or other focal bone abnormality. Lower lumbar spine degenerative changes and peripheral vascular calcification noted. IMPRESSION: No acute findings. Lower lumbar spine degenerative changes. Electronically Signed   By: Norleen DELENA Kil M.D.   On: 04/25/2024 11:19   DG Hand Complete Left Result Date: 04/25/2024 CLINICAL DATA:  Fall.  Left hand pain. EXAM: LEFT HAND - COMPLETE 3+ VIEW COMPARISON:  None Available. FINDINGS: There is no evidence of fracture or dislocation. 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 Generalized osteopenia also noted. IMPRESSION: No acute findings. DIP joint osteoarthritis. Electronically Signed   By: Norleen DELENA Kil M.D.   On: 04/25/2024 11:15   CT Cervical Spine Wo Contrast Result Date: 04/25/2024 EXAM: CT CERVICAL SPINE WITHOUT CONTRAST 04/25/2024 09:29:48 AM TECHNIQUE: CT of the cervical spine was performed without the administration of intravenous contrast. Multiplanar reformatted images are provided for review. Automated exposure control, iterative reconstruction, and/or weight based adjustment of the mA/kV was utilized to reduce the radiation dose to as low as reasonably achievable. COMPARISON: CT of the cervical spine dated 01/07/2024. CLINICAL HISTORY: Neck trauma (Age >= 65y) FINDINGS: BONES AND ALIGNMENT: There is reversal of the normal cervical lordosis. No acute fracture or traumatic malalignment. DEGENERATIVE CHANGES: There is moderate chronic degenerative disc disease at C4-C5  and C5-C6. There is mild-to-moderate central spinal canal stenosis and right neural foraminal stenosis at C4-C5. There is mild left neural foraminal stenosis at C5-C6. SOFT TISSUES: No prevertebral soft tissue swelling. There are calcifications within the carotid bulbs bilaterally. IMPRESSION: 1. No acute findings. 2. Reversal of the normal cervical lordosis with moderate chronic degenerative disc disease at C4-5 and C5-6, including mild-to-moderate central spinal canal stenosis and right neural foraminal stenosis at C4-5, and mild left neural foraminal stenosis at C5-6. 3. Bilateral carotid bulb calcifications consistent with atherosclerosis. Electronically signed by: Evalene Coho MD 04/25/2024 09:36 AM EST RP Workstation: [id number]   DG Chest Port 1 View Result Date: 04/25/2024 EXAM: 1 VIEW(S) XRAY OF THE CHEST 04/25/2024 09:26:00 AM COMPARISON: 01/07/2024 CLINICAL HISTORY: pain trauma  FINDINGS: LUNGS AND PLEURA: Mild streaky atelectasis/scarring is present medially within the left lung base. No pleural effusion. No pneumothorax. HEART AND MEDIASTINUM: No acute abnormality of the cardiac and mediastinal silhouettes. BONES AND SOFT TISSUES: No acute osseous abnormality. IMPRESSION: 1. No acute cardiopulmonary process. 2. Mild streaky atelectasis/scarring medially within the left lung base. Electronically signed by: Evalene Coho MD 04/25/2024 09:34 AM EST RP Workstation: [id number]   DG Hand Complete Right Result Date: 04/25/2024 EXAM: 3 VIEW(S) XRAY OF THE RIGHT HAND 04/25/2024 09:26:00 AM COMPARISON: None available. CLINICAL HISTORY: pain trauma FINDINGS: BONES AND JOINTS: The bones are osteopenic, but intact. No acute fracture. No malalignment. There are mild degenerative changes present within the first carpometacarpal joint and the second and fifth distal interphalangeal joints. SOFT TISSUES: The soft tissues are unremarkable. IMPRESSION: 1. No acute fracture or dislocation. 2. Osteopenia. 3.  Mild degenerative changes in the first carpometacarpal joint and the second and fifth distal interphalangeal joints. Electronically signed by: Evalene Coho MD 04/25/2024 09:33 AM EST RP Workstation: [id number]   CT Head Wo Contrast Result Date: 04/25/2024 EXAM: CT HEAD WITHOUT CONTRAST 04/25/2024 09:29:48 AM TECHNIQUE: CT of the head was performed without the administration of intravenous contrast. Automated exposure control, iterative reconstruction, and/or weight based adjustment of the mA/kV was utilized to reduce the radiation dose to as low as reasonably achievable. COMPARISON: 01/07/2024 CLINICAL HISTORY: Head trauma, minor (Age >= 65y) FINDINGS: BRAIN AND VENTRICLES: No acute hemorrhage. No evidence of acute infarct. Mild cerebral volume loss with associated ex vacuo ventricular dilatation. Mild periventricular white matter hypodensities. Atherosclerotic calcifications of the cavernous internal carotid arteries. No hydrocephalus. No extra-axial collection. No mass effect or midline shift. ORBITS: No acute abnormality. SINUSES: No acute abnormality. SOFT TISSUES AND SKULL: No acute soft tissue abnormality. No skull fracture. IMPRESSION: 1. No acute intracranial abnormality related to the minor head trauma. 2. Mild cerebral volume loss with associated ex vacuo ventricular dilatation and mild periventricular white matter hypodensities. 3. Atherosclerotic calcifications of the cavernous internal carotid arteries. Electronically signed by: Evalene Coho MD 04/25/2024 09:32 AM EST RP Workstation: [id number]     .Critical Care  Performed by: Levander Houston, MD Authorized by: Levander Houston, MD   Critical care provider statement:    Critical care time (minutes):  60   Critical care end time:  04/25/2024 3:01 PM   Critical care was time spent personally by me on the following activities:  Development of treatment plan with patient or surrogate, discussions with consultants, evaluation of patient's  response to treatment, examination of patient, ordering and review of laboratory studies, ordering and review of radiographic studies, ordering and performing treatments and interventions, pulse oximetry, re-evaluation of patient's condition and review of old charts    Medications Ordered in the ED - No data to display  Clinical Course as of 04/25/24 1501  Mon Apr 25, 2024  1007 Cervical spine x-rays reviewed and interpreted and no evidence of acute fracture or malalignment is noted on my interpretation and radiologist interpretation concurs [DR]  1008 Chest x-Amad Mau with mild streaky atelectasis at left lung base [DR]  1008 Right hand x-Helayna Dun with no acute fracture or dislocation noted [DR]  1008 CT head with no acute intracranial abnormality [DR]  1123 Left hip and pelvis x-Jillyan Plitt reviewed interpreted no evidence of acute abnormality noted and radiologist interpretation concurs [DR]  1123 Left hand x-Talar Fraley without acute abnormality noted [DR]  1123 Right hand x-Iyanla Eilers without acute abnormality noted [DR]  1435 CBC and basic metabolic panel  within normal limits [DR]    Clinical Course User Index [DR] Levander Houston, MD                                 Medical Decision Making Amount and/or Complexity of Data Reviewed Labs: ordered. Radiology: ordered.   78 year old female with history of Alzheimer's dementia who apparently wandered from the home last night.  She was found outside down a short basement.  She has abrasions to her hand but no other obvious signs of trauma. Patient evaluated here for fall and trauma with CT of head and neck and bilateral hand x-rays, left hip and pelvis x-Jovana Rembold.  No obvious abnormalities or fractures were seen. Patient was initially hypothermic with temperature of 90.  She is placed on warming blanket has rewarmed here in the ED. Urinalysis obtained is nitrite positive but has only 0-5 red blood cells, 0-5 white blood cells and few bacteria with squamous epithelial cells  present.  Doubt urinary tract infection. CBC is significant for white blood cell count of 12,900 otherwise within normal limits Awaiting remainder of labs Patient is to be ambulated. Patient ambulated without difficulty      Final diagnoses:  Alzheimer's dementia, unspecified dementia severity, unspecified timing of dementia onset, unspecified whether behavioral, psychotic, or mood disturbance or anxiety (HCC)  Fall, initial encounter  Abrasions of multiple sites  Hypothermia, initial encounter    ED Discharge Orders     None          Levander Houston, MD 04/25/24 1501  "

## 2024-04-25 NOTE — ED Notes (Signed)
 Patient transported to X-ray
